# Patient Record
Sex: Male | Born: 1945 | Race: White | Hispanic: No | Marital: Married | Smoking: Former smoker
Health system: Southern US, Community
[De-identification: ages and names within clinical notes are randomized; demographics above are authoritative.]

## PROBLEM LIST (undated history)

## (undated) DIAGNOSIS — E119 Type 2 diabetes mellitus without complications: Secondary | ICD-10-CM

## (undated) DIAGNOSIS — I509 Heart failure, unspecified: Secondary | ICD-10-CM

## (undated) DIAGNOSIS — R51 Headache: Secondary | ICD-10-CM

## (undated) DIAGNOSIS — I4891 Unspecified atrial fibrillation: Secondary | ICD-10-CM

## (undated) DIAGNOSIS — K5790 Diverticulosis of intestine, part unspecified, without perforation or abscess without bleeding: Secondary | ICD-10-CM

## (undated) DIAGNOSIS — C801 Malignant (primary) neoplasm, unspecified: Secondary | ICD-10-CM

## (undated) DIAGNOSIS — I10 Essential (primary) hypertension: Secondary | ICD-10-CM

## (undated) DIAGNOSIS — F329 Major depressive disorder, single episode, unspecified: Secondary | ICD-10-CM

## (undated) DIAGNOSIS — K602 Anal fissure, unspecified: Secondary | ICD-10-CM

## (undated) DIAGNOSIS — R0600 Dyspnea, unspecified: Secondary | ICD-10-CM

## (undated) DIAGNOSIS — G4730 Sleep apnea, unspecified: Secondary | ICD-10-CM

## (undated) DIAGNOSIS — I499 Cardiac arrhythmia, unspecified: Secondary | ICD-10-CM

## (undated) DIAGNOSIS — F32A Depression, unspecified: Secondary | ICD-10-CM

## (undated) DIAGNOSIS — F419 Anxiety disorder, unspecified: Secondary | ICD-10-CM

## (undated) DIAGNOSIS — R519 Headache, unspecified: Secondary | ICD-10-CM

## (undated) HISTORY — PX: HIP SURGERY: SHX245

## (undated) HISTORY — DX: Unspecified atrial fibrillation: I48.91

## (undated) HISTORY — DX: Diverticulosis of intestine, part unspecified, without perforation or abscess without bleeding: K57.90

## (undated) HISTORY — PX: CARDIAC ELECTROPHYSIOLOGY MAPPING AND ABLATION: SHX1292

## (undated) HISTORY — PX: COLONOSCOPY: SHX174

## (undated) HISTORY — DX: Anal fissure, unspecified: K60.2

## (undated) HISTORY — PX: TONSILLECTOMY: SUR1361

---

## 2007-08-13 DIAGNOSIS — C801 Malignant (primary) neoplasm, unspecified: Secondary | ICD-10-CM

## 2007-08-13 HISTORY — DX: Malignant (primary) neoplasm, unspecified: C80.1

## 2007-08-13 HISTORY — PX: JOINT REPLACEMENT: SHX530

## 2017-07-30 NOTE — H&P (Signed)
TOTAL KNEE ADMISSION H&P  Patient is being admitted for left total knee arthroplasty.  Subjective:  Chief Complaint:   Left knee primary OA / pain  HPI: Robert Sutton, 71 y.o. male, has a history of pain and functional disability in the left knee due to arthritis and has failed non-surgical conservative treatments for greater than 12 weeks to include NSAID's and/or analgesics, corticosteriod injections, use of assistive devices and activity modification.  Onset of symptoms was gradual, starting 7-9 years ago with gradually worsening course since that time. The patient noted no past surgery on the left knee(s).  Patient currently rates pain in the left knee(s) at 7 out of 10 with activity. Patient has worsening of pain with activity and weight bearing, pain that interferes with activities of daily living, pain with passive range of motion, crepitus and joint swelling.  Patient has evidence of periarticular osteophytes and joint space narrowing by imaging studies.  There is no active infection.  Risks, benefits and expectations were discussed with the patient.  Risks including but not limited to the risk of anesthesia, blood clots, nerve damage, blood vessel damage, failure of the prosthesis, infection and up to and including death.  Patient understand the risks, benefits and expectations and wishes to proceed with surgery.   PCP: Raquel James, MD  D/C Plans:       Home   Post-op Meds:       No Rx given  Tranexamic Acid:      To be given - IV   Decadron:      Is to be given  FYI:     Eliquis  Norco  DME:   Pt already has equipment  PT:   OPPT Rx given   Past Medical History:  Diagnosis Date  . Anxiety   . Cancer [facility]) 2009   Prostate  . Depression   . Diabetes mellitus without complication ([place])   . Dyspnea   . Dysrhythmia   . Headache   . Hypertension   . Sleep apnea     Past Surgical History:  Procedure Laterality Date  . JOINT REPLACEMENT  2009   R Knee replacement  .  TONSILLECTOMY      No current facility-administered medications for this encounter.    Current Outpatient Medications  Medication Sig Dispense Refill Last Dose  . acetaminophen (TYLENOL) 500 MG tablet Take 1,000 mg by mouth every 6 (six) hours as needed for moderate pain or headache.     . albuterol (PROVENTIL HFA;VENTOLIN HFA) 108 (90 Base) MCG/ACT inhaler Inhale 1 puff into the lungs every 6 (six) hours as needed for wheezing or shortness of breath.     . alprazolam (XANAX) 2 MG tablet Take 2 mg by mouth 2 (two) times daily as needed for anxiety.     Marland Kitchen amLODipine (NORVASC) 5 MG tablet Take 5 mg by mouth 2 (two) times daily.      Marland Kitchen apixaban (ELIQUIS) 5 MG TABS tablet Take 5 mg by mouth 2 (two) times daily.     . fluticasone (FLONASE) 50 MCG/ACT nasal spray Place 1 spray into both nostrils 2 (two) times daily.     . furosemide (LASIX) 80 MG tablet Take 80-120 mg by mouth See admin instructions. Take 120 mg by mouth in the morning and take 80 mg by mouth in the early evening     . gabapentin (NEURONTIN) 300 MG capsule Take 600 mg by mouth 3 (three) times daily.     Marland Kitchen glipiZIDE (  GLUCOTROL) 10 MG tablet Take 10 mg by mouth 2 (two) times daily.     . insulin glargine (LANTUS) 100 unit/mL SOPN Inject 30 Units into the skin at bedtime.     Marland Kitchen lisinopril (PRINIVIL,ZESTRIL) 40 MG tablet Take 20 mg by mouth 2 (two) times daily.     . magnesium oxide (MAG-OX) 400 MG tablet Take 400 mg by mouth 2 (two) times daily.     Marland Kitchen omeprazole (PRILOSEC) 20 MG capsule Take 20 mg by mouth 2 (two) times daily.     . Polyvinyl Alcohol (LIQUID TEARS OP) Place 1-2 drops into both eyes daily as needed (for dry eyes).     . potassium chloride SA (K-DUR,KLOR-CON) 20 MEQ tablet Take 20-40 mEq by mouth See admin instructions. Take 40 mg by mouth in the morning and take 20 mg by mouth in the early evening     . vitamin B-12 (CYANOCOBALAMIN) 1000 MCG tablet Take 1,000 mcg by mouth daily.      No Known Allergies   Social  History   Tobacco Use  . Smoking status: Former Research scientist (life sciences)  . Smokeless tobacco: Never Used  . Tobacco comment: Quit 40 years ago  Substance Use Topics  . Alcohol use: Yes    Frequency: Never    Comment: Cocktail-Daily       Review of Systems  Constitutional: Negative.   HENT: Negative.   Eyes: Negative.   Respiratory: Negative.   Cardiovascular: Negative.   Gastrointestinal: Negative.   Genitourinary: Negative.   Musculoskeletal: Positive for joint pain.  Skin: Negative.   Neurological: Positive for headaches.  Endo/Heme/Allergies: Negative.   Psychiatric/Behavioral: Positive for depression. The patient is nervous/anxious.     Objective:  Physical Exam  Constitutional: He is oriented to person, place, and time. He appears well-developed.  HENT:  Head: Normocephalic.  Eyes: Pupils are equal, round, and reactive to light.  Neck: Neck supple. No JVD present. No tracheal deviation present. No thyromegaly present.  Cardiovascular: Normal rate, regular rhythm and intact distal pulses.  Respiratory: Effort normal and breath sounds normal. No respiratory distress. He has no wheezes.  GI: Soft. There is no tenderness. There is no guarding.  Musculoskeletal:       Left knee: He exhibits decreased range of motion, swelling and bony tenderness. He exhibits no ecchymosis, no deformity, no laceration and no erythema. Tenderness found.  Lymphadenopathy:    He has no cervical adenopathy.  Neurological: He is alert and oriented to person, place, and time. A sensory deficit (bilateral LE neuropathy) is present.  Skin: Skin is warm and dry.  Psychiatric: He has a normal mood and affect.      Imaging Review Plain radiographs demonstrate severe degenerative joint disease of the left knee.  The bone quality appears to be good for age and reported activity level.  Assessment/Plan:  End stage arthritis, left knee   The patient history, physical examination, clinical judgment of the  provider and imaging studies are consistent with end stage degenerative joint disease of the left knee and total knee arthroplasty is deemed medically necessary. The treatment options including medical management, injection therapy arthroscopy and arthroplasty were discussed at length. The risks and benefits of total knee arthroplasty were presented and reviewed. The risks due to aseptic loosening, infection, stiffness, patella tracking problems, thromboembolic complications and other imponderables were discussed. The patient acknowledged the explanation, agreed to proceed with the plan and consent was signed. Patient is being admitted for inpatient treatment for surgery, pain control,  PT, OT, prophylactic antibiotics, VTE prophylaxis, progressive ambulation and ADL's and discharge planning. The patient is planning to be discharged home.     [place] Pugh [place] Valli   PA-C  08/15/2017, 12:16 PM

## 2017-07-31 NOTE — Progress Notes (Signed)
NEED ORDERS IN Epic FOR 1-8 SURGERY, PRE OP IS 1-3

## 2017-08-13 NOTE — Progress Notes (Signed)
07-29-17 Cardiac clearance from [facility] [place]....   07-29-17 CBC (WNL) CMP (Abnormal)   07-15-17 ECHO on chart from [facility]  07-02-17 Stress Test on chart  06-29-17 HGA1C 5.7 on chart

## 2017-08-13 NOTE — Patient Instructions (Addendum)
Robert Sutton  08/13/2017   Your procedure is scheduled on: 08-19-17   Report to [facility] Main  Entrance Report to Short Stay/PACU at 5:30 AM   Call this number if you have problems the morning of surgery [phone]    Do not eat food or drink liquids :After Midnight.     Take these medicines the morning of surgery with A SIP OF WATER: Amlodipine (Norvasc), Gabapentin (Neurontin), and Omeprazole (Prilosec). You may also bring and use your inhaler and eyedrop as needed. DO NOT TAKE ANY DIABETIC MEDICATIONS DAY OF YOUR SURGERY                               You may not have any metal on your body including hair pins and              piercings  Do not wear jewelry, lotions, powders or  deodorant             Men may shave face and neck.   Do not bring valuables to the hospital. [place].  Contacts, dentures or bridgework may not be worn into surgery.  Leave suitcase in the car. After surgery it may be brought to your room.              Please read over the following fact sheets you were given: _____________________________________________________________________ How to Manage Your Diabetes Before and After Surgery  Why is it important to control my blood sugar before and after surgery? . Improving blood sugar levels before and after surgery helps healing and can limit problems. . A way of improving blood sugar control is eating a healthy diet by: o  Eating less sugar and carbohydrates o  Increasing activity/exercise o  Talking with your doctor about reaching your blood sugar goals . High blood sugars (greater than 180 mg/dL) can raise your risk of infections and slow your recovery, so you will need to focus on controlling your diabetes during the weeks before surgery. . Make sure that the doctor who takes care of your diabetes knows about your planned surgery including the date and location.  How do I manage  my blood sugar before surgery? . Check your blood sugar at least 4 times a day, starting 2 days before surgery, to make sure that the level is not too high or low. o Check your blood sugar the morning of your surgery when you wake up and every 2 hours until you get to the Short Stay unit. . If your blood sugar is less than 70 mg/dL, you will need to treat for low blood sugar: o Do not take insulin. o Treat a low blood sugar (less than 70 mg/dL) with  cup of clear juice (cranberry or apple), 4 glucose tablets, OR glucose gel. o Recheck blood sugar in 15 minutes after treatment (to make sure it is greater than 70 mg/dL). If your blood sugar is not greater than 70 mg/dL on recheck, call [phone] for further instructions. . Report your blood sugar to the short stay nurse when you get to Short Stay.  . If you are admitted to the hospital after surgery: o Your blood sugar will be checked by the staff and you will probably  be given insulin after surgery (instead of oral diabetes medicines) to make sure you have good blood sugar levels. o The goal for blood sugar control after surgery is 80-180 mg/dL.   WHAT DO I DO ABOUT MY DIABETES MEDICATION?  Marland Kitchen Do not take oral diabetes medicines (pills) the morning of surgery.  . THE DAY BEFORE SURGERY take only your morning dose of Glipizide  . THE NIGHT BEFORE SURGERY, take only 15 U of Lantus     Reviewed and Endorsed by [facility] Patient Education Committee, August 2015            [facility] - Preparing for Surgery Before surgery, you can play an important role.  Because skin is not sterile, your skin needs to be as free of germs as possible.  You can reduce the number of germs on your skin by washing with CHG (chlorahexidine gluconate) soap before surgery.  CHG is an antiseptic cleaner which kills germs and bonds with the skin to continue killing germs even after washing. Please DO NOT use if you have an allergy to CHG or antibacterial soaps.   If your skin becomes reddened/irritated stop using the CHG and inform your nurse when you arrive at Short Stay. Do not shave (including legs and underarms) for at least 48 hours prior to the first CHG shower.  You may shave your face/neck. Please follow these instructions carefully:  1.  Shower with CHG Soap the night before surgery and the  morning of Surgery.  2.  If you choose to wash your hair, wash your hair first as usual with your  normal  shampoo.  3.  After you shampoo, rinse your hair and body thoroughly to remove the  shampoo.                           4.  Use CHG as you would any other liquid soap.  You can apply chg directly  to the skin and wash                       Gently with a scrungie or clean washcloth.  5.  Apply the CHG Soap to your body ONLY FROM THE NECK DOWN.   Do not use on face/ open                           Wound or open sores. Avoid contact with eyes, ears mouth and genitals (private parts).                       Wash face,  Genitals (private parts) with your normal soap.             6.  Wash thoroughly, paying special attention to the area where your surgery  will be performed.  7.  Thoroughly rinse your body with warm water from the neck down.  8.  DO NOT shower/wash with your normal soap after using and rinsing off  the CHG Soap.                9.  Pat yourself dry with a clean towel.            10.  Wear clean pajamas.            11.  Place clean sheets on your bed the night of your first shower and do not  sleep with pets. Day of Surgery : Do not apply any lotions/deodorants the morning of surgery.  Please wear clean clothes to the hospital/surgery center.  FAILURE TO FOLLOW THESE INSTRUCTIONS MAY RESULT IN THE CANCELLATION OF YOUR SURGERY PATIENT SIGNATURE_________________________________  NURSE SIGNATURE__________________________________  ________________________________________________________________________   [name]  An incentive  spirometer is a tool that can help keep your lungs clear and active. This tool measures how well you are filling your lungs with each breath. Taking long deep breaths may help reverse or decrease the chance of developing breathing (pulmonary) problems (especially infection) following:  A long period of time when you are unable to move or be active. BEFORE THE PROCEDURE   If the spirometer includes an indicator to show your best effort, your nurse or respiratory therapist will set it to a desired goal.  If possible, sit up straight or lean slightly forward. Try not to slouch.  Hold the incentive spirometer in an upright position. INSTRUCTIONS FOR USE  1. Sit on the edge of your bed if possible, or sit up as far as you can in bed or on a chair. 2. Hold the incentive spirometer in an upright position. 3. Breathe out normally. 4. Place the mouthpiece in your mouth and seal your lips tightly around it. 5. Breathe in slowly and as deeply as possible, raising the piston or the ball toward the top of the column. 6. Hold your breath for 3-5 seconds or for as long as possible. Allow the piston or ball to fall to the bottom of the column. 7. Remove the mouthpiece from your mouth and breathe out normally. 8. Rest for a few seconds and repeat Steps 1 through 7 at least 10 times every 1-2 hours when you are awake. Take your time and take a few normal breaths between deep breaths. 9. The spirometer may include an indicator to show your best effort. Use the indicator as a goal to work toward during each repetition. 10. After each set of 10 deep breaths, practice coughing to be sure your lungs are clear. If you have an incision (the cut made at the time of surgery), support your incision when coughing by placing a pillow or rolled up towels firmly against it. Once you are able to get out of bed, walk around indoors and cough well. You may stop using the incentive spirometer when instructed by your caregiver.   RISKS AND COMPLICATIONS  Take your time so you do not get dizzy or light-headed.  If you are in pain, you may need to take or ask for pain medication before doing incentive spirometry. It is harder to take a deep breath if you are having pain. AFTER USE  Rest and breathe slowly and easily.  It can be helpful to keep track of a log of your progress. Your caregiver can provide you with a simple table to help with this. If you are using the spirometer at home, follow these instructions: [place] IF:   You are having difficultly using the spirometer.  You have trouble using the spirometer as often as instructed.  Your pain medication is not giving enough relief while using the spirometer.  You develop fever of 100.5 F (38.1 C) or higher. SEEK IMMEDIATE MEDICAL CARE IF:   You cough up bloody sputum that had not been present before.  You develop fever of 102 F (38.9 C) or greater.  You develop worsening pain at or near the incision site. MAKE SURE YOU:  Understand these instructions.  Will watch your condition.  Will get help right away if you are not doing well or get worse. Document Released: 12/09/2006 Document Revised: 10/21/2011 Document Reviewed: 02/09/2007 ExitCare Patient Information 2014 ExitCare, [place].   ________________________________________________________________________  WHAT IS A BLOOD TRANSFUSION? Blood Transfusion Information  A transfusion is the replacement of blood or some of its parts. Blood is made up of multiple cells which provide different functions.  Red blood cells carry oxygen and are used for blood loss replacement.  White blood cells fight against infection.  Platelets control bleeding.  Plasma helps clot blood.  Other blood products are available for specialized needs, such as hemophilia or other clotting disorders. BEFORE THE TRANSFUSION  Who gives blood for transfusions?   Healthy volunteers who are fully evaluated  to make sure their blood is safe. This is blood bank blood. Transfusion therapy is the safest it has ever been in the practice of medicine. Before blood is taken from a donor, a complete history is taken to make sure that person has no history of diseases nor engages in risky social behavior (examples are intravenous drug use or sexual activity with multiple partners). The donor's travel history is screened to minimize risk of transmitting infections, such as malaria. The donated blood is tested for signs of infectious diseases, such as HIV and hepatitis. The blood is then tested to be sure it is compatible with you in order to minimize the chance of a transfusion reaction. If you or a relative donates blood, this is often done in anticipation of surgery and is not appropriate for emergency situations. It takes many days to process the donated blood. RISKS AND COMPLICATIONS Although transfusion therapy is very safe and saves many lives, the main dangers of transfusion include:   Getting an infectious disease.  Developing a transfusion reaction. This is an allergic reaction to something in the blood you were given. Every precaution is taken to prevent this. The decision to have a blood transfusion has been considered carefully by your caregiver before blood is given. Blood is not given unless the benefits outweigh the risks. AFTER THE TRANSFUSION  Right after receiving a blood transfusion, you will usually feel much better and more energetic. This is especially true if your red blood cells have gotten low (anemic). The transfusion raises the level of the red blood cells which carry oxygen, and this usually causes an energy increase.  The nurse administering the transfusion will monitor you carefully for complications. HOME CARE INSTRUCTIONS  No special instructions are needed after a transfusion. You may find your energy is better. Speak with your caregiver about any limitations on activity for  underlying diseases you may have. SEEK MEDICAL CARE IF:   Your condition is not improving after your transfusion.  You develop redness or irritation at the intravenous (IV) site. SEEK IMMEDIATE MEDICAL CARE IF:  Any of the following symptoms occur over the next 12 hours:  Shaking chills.  You have a temperature by mouth above 102 F (38.9 C), not controlled by medicine.  Chest, back, or muscle pain.  People around you feel you are not acting correctly or are confused.  Shortness of breath or difficulty breathing.  Dizziness and fainting.  You get a rash or develop hives.  You have a decrease in urine output.  Your urine turns a dark color or changes to pink, red, or brown. Any of the following symptoms occur over the next 10 days:  You have a temperature  by mouth above 102 F (38.9 C), not controlled by medicine.  Shortness of breath.  Weakness after normal activity.  The white part of the eye turns yellow (jaundice).  You have a decrease in the amount of urine or are urinating less often.  Your urine turns a dark color or changes to pink, red, or brown. Document Released: 07/26/2000 Document Revised: 10/21/2011 Document Reviewed: 03/14/2008 [facility] Patient Information 2014 [place], [place].  _______________________________________________________________________

## 2017-08-14 ENCOUNTER — Other Ambulatory Visit: Payer: Self-pay

## 2017-08-14 ENCOUNTER — Encounter (HOSPITAL_COMMUNITY)
Admission: RE | Admit: 2017-08-14 | Discharge: 2017-08-14 | Disposition: A | Discharge status: Home / Self Care | Payer: Non-veteran care | Source: Ambulatory Visit | Attending: Orthopedic Surgery | Admitting: Orthopedic Surgery

## 2017-08-14 ENCOUNTER — Encounter (HOSPITAL_COMMUNITY): Payer: Self-pay

## 2017-08-14 DIAGNOSIS — Z01812 Encounter for preprocedural laboratory examination: Secondary | ICD-10-CM | POA: Diagnosis present

## 2017-08-14 DIAGNOSIS — M1712 Unilateral primary osteoarthritis, left knee: Secondary | ICD-10-CM | POA: Insufficient documentation

## 2017-08-14 HISTORY — DX: Depression, unspecified: F32.A

## 2017-08-14 HISTORY — DX: Malignant (primary) neoplasm, unspecified: C80.1

## 2017-08-14 HISTORY — DX: Anxiety disorder, unspecified: F41.9

## 2017-08-14 HISTORY — DX: Cardiac arrhythmia, unspecified: I49.9

## 2017-08-14 HISTORY — DX: Type 2 diabetes mellitus without complications: E11.9

## 2017-08-14 HISTORY — DX: Sleep apnea, unspecified: G47.30

## 2017-08-14 HISTORY — DX: Dyspnea, unspecified: R06.00

## 2017-08-14 HISTORY — DX: Major depressive disorder, single episode, unspecified: F32.9

## 2017-08-14 HISTORY — DX: Headache, unspecified: R51.9

## 2017-08-14 HISTORY — DX: Essential (primary) hypertension: I10

## 2017-08-14 HISTORY — DX: Headache: R51

## 2017-08-14 LAB — GLUCOSE, CAPILLARY: Glucose-Capillary: 150 mg/dL — ABNORMAL HIGH (ref 65–99)

## 2017-08-14 LAB — BASIC METABOLIC PANEL
Anion gap: 8 (ref 5–15)
BUN: 21 mg/dL — ABNORMAL HIGH (ref 6–20)
CALCIUM: 9.2 mg/dL (ref 8.9–10.3)
CO2: 33 mmol/L — AB (ref 22–32)
CREATININE: 1.29 mg/dL — AB (ref 0.61–1.24)
Chloride: 97 mmol/L — ABNORMAL LOW (ref 101–111)
GFR calc non Af Amer: 54 mL/min — ABNORMAL LOW (ref >60)
GLUCOSE: 119 mg/dL — AB (ref 65–99)
Potassium: 4.3 mmol/L (ref 3.5–5.1)
Sodium: 138 mmol/L (ref 135–145)

## 2017-08-14 LAB — ABO/RH: ABO/RH(D): A POS

## 2017-08-14 LAB — SURGICAL PCR SCREEN
MRSA, PCR: NEGATIVE
STAPHYLOCOCCUS AUREUS: POSITIVE — AB

## 2017-08-15 NOTE — Progress Notes (Signed)
08-14-17 PCR result routed to Dr. Alvan Dame for review.

## 2017-08-18 MED ORDER — DEXTROSE 5 % IV SOLN
3.0000 g | INTRAVENOUS | Status: AC
  Administered 2017-08-19: 3 g via INTRAVENOUS
  Filled 2017-08-18: qty 3

## 2017-08-19 ENCOUNTER — Encounter (HOSPITAL_COMMUNITY)
Admission: RE | Disposition: A | Discharge status: Home / Self Care | Payer: Self-pay | Source: Ambulatory Visit | Attending: Orthopedic Surgery

## 2017-08-19 ENCOUNTER — Inpatient Hospital Stay (HOSPITAL_COMMUNITY): Payer: Non-veteran care | Admitting: Certified Registered Nurse Anesthetist

## 2017-08-19 ENCOUNTER — Inpatient Hospital Stay (HOSPITAL_COMMUNITY)
Admission: RE | Admit: 2017-08-19 | Discharge: 2017-08-22 | DRG: 470 | Disposition: A | Discharge status: Home / Self Care | Payer: Non-veteran care | Source: Ambulatory Visit | Attending: Orthopedic Surgery | Admitting: Orthopedic Surgery

## 2017-08-19 ENCOUNTER — Other Ambulatory Visit: Payer: Self-pay

## 2017-08-19 ENCOUNTER — Encounter (HOSPITAL_COMMUNITY): Payer: Self-pay | Admitting: *Deleted

## 2017-08-19 DIAGNOSIS — Z96652 Presence of left artificial knee joint: Secondary | ICD-10-CM

## 2017-08-19 DIAGNOSIS — I5022 Chronic systolic (congestive) heart failure: Secondary | ICD-10-CM | POA: Diagnosis present

## 2017-08-19 DIAGNOSIS — F329 Major depressive disorder, single episode, unspecified: Secondary | ICD-10-CM | POA: Diagnosis present

## 2017-08-19 DIAGNOSIS — M659 Synovitis and tenosynovitis, unspecified: Secondary | ICD-10-CM | POA: Diagnosis present

## 2017-08-19 DIAGNOSIS — Z96659 Presence of unspecified artificial knee joint: Secondary | ICD-10-CM

## 2017-08-19 DIAGNOSIS — Z7951 Long term (current) use of inhaled steroids: Secondary | ICD-10-CM | POA: Diagnosis not present

## 2017-08-19 DIAGNOSIS — Z794 Long term (current) use of insulin: Secondary | ICD-10-CM

## 2017-08-19 DIAGNOSIS — R0902 Hypoxemia: Secondary | ICD-10-CM | POA: Diagnosis not present

## 2017-08-19 DIAGNOSIS — E119 Type 2 diabetes mellitus without complications: Secondary | ICD-10-CM | POA: Diagnosis present

## 2017-08-19 DIAGNOSIS — M1712 Unilateral primary osteoarthritis, left knee: Principal | ICD-10-CM | POA: Diagnosis present

## 2017-08-19 DIAGNOSIS — G4730 Sleep apnea, unspecified: Secondary | ICD-10-CM | POA: Diagnosis present

## 2017-08-19 DIAGNOSIS — Z7901 Long term (current) use of anticoagulants: Secondary | ICD-10-CM | POA: Diagnosis not present

## 2017-08-19 DIAGNOSIS — Z6841 Body Mass Index (BMI) 40.0 and over, adult: Secondary | ICD-10-CM

## 2017-08-19 DIAGNOSIS — I509 Heart failure, unspecified: Secondary | ICD-10-CM | POA: Diagnosis not present

## 2017-08-19 DIAGNOSIS — Z79899 Other long term (current) drug therapy: Secondary | ICD-10-CM

## 2017-08-19 DIAGNOSIS — Z96651 Presence of right artificial knee joint: Secondary | ICD-10-CM | POA: Diagnosis present

## 2017-08-19 DIAGNOSIS — I499 Cardiac arrhythmia, unspecified: Secondary | ICD-10-CM | POA: Diagnosis not present

## 2017-08-19 DIAGNOSIS — Z7984 Long term (current) use of oral hypoglycemic drugs: Secondary | ICD-10-CM | POA: Diagnosis not present

## 2017-08-19 DIAGNOSIS — F419 Anxiety disorder, unspecified: Secondary | ICD-10-CM | POA: Diagnosis present

## 2017-08-19 DIAGNOSIS — I110 Hypertensive heart disease with heart failure: Secondary | ICD-10-CM | POA: Diagnosis present

## 2017-08-19 DIAGNOSIS — Z87891 Personal history of nicotine dependence: Secondary | ICD-10-CM

## 2017-08-19 HISTORY — DX: Heart failure, unspecified: I50.9

## 2017-08-19 HISTORY — PX: TOTAL KNEE ARTHROPLASTY: SHX125

## 2017-08-19 LAB — GLUCOSE, CAPILLARY
GLUCOSE-CAPILLARY: 146 mg/dL — AB (ref 65–99)
GLUCOSE-CAPILLARY: 170 mg/dL — AB (ref 65–99)
GLUCOSE-CAPILLARY: 170 mg/dL — AB (ref 65–99)
Glucose-Capillary: 228 mg/dL — ABNORMAL HIGH (ref 65–99)

## 2017-08-19 LAB — CBC WITH DIFFERENTIAL/PLATELET
BASOS PCT: 0 %
Basophils Absolute: 0.1 10*3/uL (ref 0.0–0.1)
Eosinophils Absolute: 0.2 10*3/uL (ref 0.0–0.7)
Eosinophils Relative: 2 %
HEMATOCRIT: 46.1 % (ref 39.0–52.0)
Hemoglobin: 15.4 g/dL (ref 13.0–17.0)
LYMPHS ABS: 3 10*3/uL (ref 0.7–4.0)
LYMPHS PCT: 22 %
MCH: 33.1 pg (ref 26.0–34.0)
MCHC: 33.4 g/dL (ref 30.0–36.0)
MCV: 99.1 fL (ref 78.0–100.0)
MONO ABS: 1.1 10*3/uL — AB (ref 0.1–1.0)
MONOS PCT: 8 %
Neutro Abs: 9.3 10*3/uL — ABNORMAL HIGH (ref 1.7–7.7)
Neutrophils Relative %: 68 %
Platelets: 187 10*3/uL (ref 150–400)
RBC: 4.65 MIL/uL (ref 4.22–5.81)
RDW: 15 % (ref 11.5–15.5)
WBC: 13.6 10*3/uL — ABNORMAL HIGH (ref 4.0–10.5)

## 2017-08-19 LAB — TYPE AND SCREEN
ABO/RH(D): A POS
ABO/RH(D): A POS
ANTIBODY SCREEN: NEGATIVE
ANTIBODY SCREEN: NEGATIVE

## 2017-08-19 SURGERY — ARTHROPLASTY, KNEE, TOTAL
Anesthesia: Regional | Site: Knee | Laterality: Left

## 2017-08-19 MED ORDER — PROPOFOL 10 MG/ML IV BOLUS
INTRAVENOUS | Status: AC
  Filled 2017-08-19: qty 60

## 2017-08-19 MED ORDER — ONDANSETRON HCL 4 MG/2ML IJ SOLN: 4.0000 mg | Freq: Four times a day (QID) | INTRAMUSCULAR | Status: DC | PRN

## 2017-08-19 MED ORDER — SODIUM CHLORIDE 0.9 % IR SOLN
Status: DC | PRN
  Administered 2017-08-19: 1000 mL

## 2017-08-19 MED ORDER — HYDROMORPHONE HCL 1 MG/ML IJ SOLN
INTRAMUSCULAR | Status: AC
Start: 2017-08-19 — End: 2017-08-19
  Filled 2017-08-19: qty 1

## 2017-08-19 MED ORDER — APIXABAN 2.5 MG PO TABS
2.5000 mg | ORAL_TABLET | Freq: Two times a day (BID) | ORAL | Status: DC
  Administered 2017-08-20 – 2017-08-22 (×5): 2.5 mg via ORAL
  Filled 2017-08-19 (×5): qty 1

## 2017-08-19 MED ORDER — INSULIN ASPART 100 UNIT/ML ~~LOC~~ SOLN
0.0000 [IU] | Freq: Three times a day (TID) | SUBCUTANEOUS | Status: DC
  Administered 2017-08-19: 3 [IU] via SUBCUTANEOUS
  Administered 2017-08-20: 5 [IU] via SUBCUTANEOUS
  Administered 2017-08-20: 3 [IU] via SUBCUTANEOUS
  Administered 2017-08-20: 5 [IU] via SUBCUTANEOUS
  Administered 2017-08-22: 08:00:00 3 [IU] via SUBCUTANEOUS

## 2017-08-19 MED ORDER — DIPHENHYDRAMINE HCL 12.5 MG/5ML PO ELIX: 12.5000 mg | ORAL_SOLUTION | ORAL | Status: DC | PRN

## 2017-08-19 MED ORDER — BUPIVACAINE-EPINEPHRINE (PF) 0.25% -1:200000 IJ SOLN
INTRAMUSCULAR | Status: DC | PRN
  Administered 2017-08-19: 30 mL via PERINEURAL

## 2017-08-19 MED ORDER — ALBUMIN HUMAN 5 % IV SOLN
INTRAVENOUS | Status: DC | PRN
  Administered 2017-08-19: 08:00:00 via INTRAVENOUS

## 2017-08-19 MED ORDER — MIDAZOLAM HCL 5 MG/5ML IJ SOLN
INTRAMUSCULAR | Status: DC | PRN
  Administered 2017-08-19: 2 mg via INTRAVENOUS

## 2017-08-19 MED ORDER — POLYETHYLENE GLYCOL 3350 17 G PO PACK: 17.0000 g | PACK | Freq: Two times a day (BID) | ORAL | 0 refills | Status: DC

## 2017-08-19 MED ORDER — CEFAZOLIN SODIUM-DEXTROSE 2-4 GM/100ML-% IV SOLN
2.0000 g | Freq: Four times a day (QID) | INTRAVENOUS | Status: AC
  Administered 2017-08-19 (×2): 2 g via INTRAVENOUS
  Filled 2017-08-19 (×2): qty 100

## 2017-08-19 MED ORDER — METHOCARBAMOL 500 MG PO TABS: 500.0000 mg | ORAL_TABLET | Freq: Four times a day (QID) | ORAL | 0 refills | Status: DC | PRN

## 2017-08-19 MED ORDER — SODIUM CHLORIDE 0.9 % IJ SOLN
INTRAMUSCULAR | Status: DC | PRN
  Administered 2017-08-19: 30 mL

## 2017-08-19 MED ORDER — CHLORHEXIDINE GLUCONATE 4 % EX LIQD: 60.0000 mL | Freq: Once | CUTANEOUS | Status: DC

## 2017-08-19 MED ORDER — FUROSEMIDE 40 MG PO TABS
120.0000 mg | ORAL_TABLET | Freq: Every day | ORAL | Status: DC
  Administered 2017-08-20 – 2017-08-22 (×3): 120 mg via ORAL
  Filled 2017-08-19 (×3): qty 3

## 2017-08-19 MED ORDER — GLIPIZIDE 10 MG PO TABS
10.0000 mg | ORAL_TABLET | Freq: Two times a day (BID) | ORAL | Status: DC
  Administered 2017-08-19 – 2017-08-22 (×5): 10 mg via ORAL
  Filled 2017-08-19 (×6): qty 1

## 2017-08-19 MED ORDER — PHENYLEPHRINE HCL 10 MG/ML IJ SOLN
INTRAVENOUS | Status: DC | PRN
  Administered 2017-08-19: 75 ug/min via INTRAVENOUS

## 2017-08-19 MED ORDER — ALPRAZOLAM 1 MG PO TABS
2.0000 mg | ORAL_TABLET | Freq: Two times a day (BID) | ORAL | Status: DC | PRN
  Administered 2017-08-19 – 2017-08-21 (×3): 2 mg via ORAL
  Administered 2017-08-21: 22:00:00 1 mg via ORAL
  Filled 2017-08-19 (×4): qty 2

## 2017-08-19 MED ORDER — LACTATED RINGERS IV SOLN
INTRAVENOUS | Status: DC | PRN
  Administered 2017-08-19 (×2): via INTRAVENOUS

## 2017-08-19 MED ORDER — OXYCODONE HCL 5 MG PO TABS: 5.0000 mg | ORAL_TABLET | Freq: Once | ORAL | Status: DC | PRN

## 2017-08-19 MED ORDER — POTASSIUM CHLORIDE CRYS ER 20 MEQ PO TBCR
20.0000 meq | EXTENDED_RELEASE_TABLET | Freq: Every evening | ORAL | Status: DC
  Administered 2017-08-20 – 2017-08-21 (×2): 20 meq via ORAL
  Filled 2017-08-19 (×2): qty 1

## 2017-08-19 MED ORDER — SODIUM CHLORIDE 0.9 % IV SOLN
INTRAVENOUS | Status: DC
  Administered 2017-08-19 – 2017-08-20 (×2): via INTRAVENOUS

## 2017-08-19 MED ORDER — ALBUTEROL SULFATE (2.5 MG/3ML) 0.083% IN NEBU: 2.5000 mg | INHALATION_SOLUTION | Freq: Four times a day (QID) | RESPIRATORY_TRACT | Status: DC | PRN

## 2017-08-19 MED ORDER — CELECOXIB 200 MG PO CAPS
200.0000 mg | ORAL_CAPSULE | Freq: Two times a day (BID) | ORAL | Status: DC
  Administered 2017-08-19: 200 mg via ORAL
  Filled 2017-08-19 (×2): qty 1

## 2017-08-19 MED ORDER — HYDROMORPHONE HCL 1 MG/ML IJ SOLN
0.5000 mg | INTRAMUSCULAR | Status: DC | PRN
  Administered 2017-08-21: 1 mg via INTRAVENOUS
  Filled 2017-08-19: qty 1

## 2017-08-19 MED ORDER — FUROSEMIDE 40 MG PO TABS: 80.0000 mg | ORAL_TABLET | ORAL | Status: DC

## 2017-08-19 MED ORDER — KETOROLAC TROMETHAMINE 30 MG/ML IJ SOLN
INTRAMUSCULAR | Status: DC | PRN
  Administered 2017-08-19: 30 mg

## 2017-08-19 MED ORDER — HYDROMORPHONE HCL 1 MG/ML IJ SOLN
0.2500 mg | INTRAMUSCULAR | Status: DC | PRN
  Administered 2017-08-19 (×2): 0.5 mg via INTRAVENOUS

## 2017-08-19 MED ORDER — OXYCODONE HCL 5 MG/5ML PO SOLN: 5.0000 mg | Freq: Once | ORAL | Status: DC | PRN

## 2017-08-19 MED ORDER — FERROUS SULFATE 325 (65 FE) MG PO TABS
325.0000 mg | ORAL_TABLET | Freq: Three times a day (TID) | ORAL | Status: DC
  Administered 2017-08-20 – 2017-08-22 (×7): 325 mg via ORAL
  Filled 2017-08-19 (×7): qty 1

## 2017-08-19 MED ORDER — HYDROCODONE-ACETAMINOPHEN 7.5-325 MG PO TABS
2.0000 | ORAL_TABLET | ORAL | Status: DC | PRN
  Administered 2017-08-19 – 2017-08-20 (×3): 2 via ORAL
  Filled 2017-08-19 (×3): qty 2

## 2017-08-19 MED ORDER — BUPIVACAINE-EPINEPHRINE 0.25% -1:200000 IJ SOLN
INTRAMUSCULAR | Status: AC
  Filled 2017-08-19: qty 1

## 2017-08-19 MED ORDER — POTASSIUM CHLORIDE CRYS ER 20 MEQ PO TBCR: 20.0000 meq | EXTENDED_RELEASE_TABLET | ORAL | Status: DC

## 2017-08-19 MED ORDER — FLUTICASONE PROPIONATE 50 MCG/ACT NA SUSP
1.0000 | Freq: Two times a day (BID) | NASAL | Status: DC
  Administered 2017-08-19 – 2017-08-22 (×4): 1 via NASAL
  Filled 2017-08-19: qty 16

## 2017-08-19 MED ORDER — NON FORMULARY: 20.0000 mg | Freq: Two times a day (BID) | Status: DC

## 2017-08-19 MED ORDER — LIDOCAINE 2% (20 MG/ML) 5 ML SYRINGE
INTRAMUSCULAR | Status: DC | PRN
  Administered 2017-08-19: 100 mg via INTRAVENOUS

## 2017-08-19 MED ORDER — ACETAMINOPHEN 325 MG PO TABS: 650.0000 mg | ORAL_TABLET | ORAL | Status: DC | PRN

## 2017-08-19 MED ORDER — HYDROCODONE-ACETAMINOPHEN 7.5-325 MG PO TABS
1.0000 | ORAL_TABLET | ORAL | Status: DC | PRN
  Administered 2017-08-19: 1 via ORAL
  Filled 2017-08-19: qty 1

## 2017-08-19 MED ORDER — DEXAMETHASONE SODIUM PHOSPHATE 10 MG/ML IJ SOLN
10.0000 mg | Freq: Once | INTRAMUSCULAR | Status: AC
  Administered 2017-08-20: 10 mg via INTRAVENOUS
  Filled 2017-08-19: qty 1

## 2017-08-19 MED ORDER — DEXAMETHASONE SODIUM PHOSPHATE 10 MG/ML IJ SOLN: 10.0000 mg | Freq: Once | INTRAMUSCULAR | Status: DC

## 2017-08-19 MED ORDER — FENTANYL CITRATE (PF) 100 MCG/2ML IJ SOLN
INTRAMUSCULAR | Status: DC | PRN
  Administered 2017-08-19 (×2): 50 ug via INTRAVENOUS

## 2017-08-19 MED ORDER — ACETAMINOPHEN 650 MG RE SUPP: 650.0000 mg | RECTAL | Status: DC | PRN

## 2017-08-19 MED ORDER — BISACODYL 10 MG RE SUPP: 10.0000 mg | Freq: Every day | RECTAL | Status: DC | PRN

## 2017-08-19 MED ORDER — SODIUM CHLORIDE 0.9 % IJ SOLN
INTRAMUSCULAR | Status: AC
  Filled 2017-08-19: qty 50

## 2017-08-19 MED ORDER — DOCUSATE SODIUM 100 MG PO CAPS
100.0000 mg | ORAL_CAPSULE | Freq: Two times a day (BID) | ORAL | Status: DC
  Administered 2017-08-19 – 2017-08-22 (×6): 100 mg via ORAL
  Filled 2017-08-19 (×6): qty 1

## 2017-08-19 MED ORDER — FENTANYL CITRATE (PF) 100 MCG/2ML IJ SOLN
INTRAMUSCULAR | Status: AC
  Filled 2017-08-19: qty 2

## 2017-08-19 MED ORDER — ROPIVACAINE HCL 5 MG/ML IJ SOLN
INTRAMUSCULAR | Status: DC | PRN
  Administered 2017-08-19: 20 mL via PERINEURAL

## 2017-08-19 MED ORDER — 0.9 % SODIUM CHLORIDE (POUR BTL) OPTIME
TOPICAL | Status: DC | PRN
  Administered 2017-08-19: 1000 mL

## 2017-08-19 MED ORDER — METOCLOPRAMIDE HCL 5 MG/ML IJ SOLN: 5.0000 mg | Freq: Three times a day (TID) | INTRAMUSCULAR | Status: DC | PRN

## 2017-08-19 MED ORDER — METHOCARBAMOL 500 MG PO TABS
500.0000 mg | ORAL_TABLET | Freq: Four times a day (QID) | ORAL | Status: DC | PRN
Start: 2017-08-19 — End: 2017-08-22
  Administered 2017-08-19: 500 mg via ORAL
  Filled 2017-08-19: qty 1

## 2017-08-19 MED ORDER — POLYETHYLENE GLYCOL 3350 17 G PO PACK
17.0000 g | PACK | Freq: Two times a day (BID) | ORAL | Status: DC
  Administered 2017-08-19 – 2017-08-22 (×6): 17 g via ORAL
  Filled 2017-08-19 (×6): qty 1

## 2017-08-19 MED ORDER — AMLODIPINE BESYLATE 5 MG PO TABS
5.0000 mg | ORAL_TABLET | Freq: Two times a day (BID) | ORAL | Status: DC
  Administered 2017-08-19 – 2017-08-22 (×6): 5 mg via ORAL
  Filled 2017-08-19 (×6): qty 1

## 2017-08-19 MED ORDER — ALUM & MAG HYDROXIDE-SIMETH 200-200-20 MG/5ML PO SUSP: 15.0000 mL | ORAL | Status: DC | PRN

## 2017-08-19 MED ORDER — FUROSEMIDE 40 MG PO TABS
80.0000 mg | ORAL_TABLET | Freq: Every evening | ORAL | Status: DC
  Administered 2017-08-20 – 2017-08-21 (×2): 80 mg via ORAL
  Filled 2017-08-19 (×2): qty 2

## 2017-08-19 MED ORDER — DOCUSATE SODIUM 100 MG PO CAPS: 100.0000 mg | ORAL_CAPSULE | Freq: Two times a day (BID) | ORAL | 0 refills | Status: DC

## 2017-08-19 MED ORDER — MENTHOL 3 MG MT LOZG: 1.0000 | LOZENGE | OROMUCOSAL | Status: DC | PRN

## 2017-08-19 MED ORDER — METOCLOPRAMIDE HCL 5 MG PO TABS: 5.0000 mg | ORAL_TABLET | Freq: Three times a day (TID) | ORAL | Status: DC | PRN

## 2017-08-19 MED ORDER — KETOROLAC TROMETHAMINE 30 MG/ML IJ SOLN
INTRAMUSCULAR | Status: AC
  Filled 2017-08-19: qty 1

## 2017-08-19 MED ORDER — ONDANSETRON HCL 4 MG/2ML IJ SOLN
INTRAMUSCULAR | Status: DC | PRN
  Administered 2017-08-19: 4 mg via INTRAVENOUS

## 2017-08-19 MED ORDER — FERROUS SULFATE 325 (65 FE) MG PO TABS: 325.0000 mg | ORAL_TABLET | Freq: Three times a day (TID) | ORAL | 3 refills | Status: DC

## 2017-08-19 MED ORDER — GABAPENTIN 300 MG PO CAPS
600.0000 mg | ORAL_CAPSULE | Freq: Three times a day (TID) | ORAL | Status: DC
  Administered 2017-08-19 – 2017-08-22 (×9): 600 mg via ORAL
  Filled 2017-08-19 (×10): qty 2

## 2017-08-19 MED ORDER — MAGNESIUM CITRATE PO SOLN: 1.0000 | Freq: Once | ORAL | Status: DC | PRN

## 2017-08-19 MED ORDER — METHOCARBAMOL 1000 MG/10ML IJ SOLN
500.0000 mg | Freq: Four times a day (QID) | INTRAVENOUS | Status: DC | PRN
  Filled 2017-08-19: qty 5

## 2017-08-19 MED ORDER — INSULIN GLARGINE 100 UNIT/ML ~~LOC~~ SOLN
30.0000 [IU] | Freq: Every day | SUBCUTANEOUS | Status: DC
  Administered 2017-08-19 – 2017-08-21 (×3): 30 [IU] via SUBCUTANEOUS
  Filled 2017-08-19 (×4): qty 0.3

## 2017-08-19 MED ORDER — PROPOFOL 10 MG/ML IV BOLUS
INTRAVENOUS | Status: DC | PRN
  Administered 2017-08-19: 150 mg via INTRAVENOUS

## 2017-08-19 MED ORDER — SODIUM CHLORIDE 0.9 % IV SOLN
1000.0000 mg | Freq: Once | INTRAVENOUS | Status: AC
  Administered 2017-08-19: 1000 mg via INTRAVENOUS
  Filled 2017-08-19: qty 1100

## 2017-08-19 MED ORDER — POTASSIUM CHLORIDE CRYS ER 20 MEQ PO TBCR
40.0000 meq | EXTENDED_RELEASE_TABLET | Freq: Every day | ORAL | Status: DC
  Administered 2017-08-20 – 2017-08-22 (×3): 40 meq via ORAL
  Filled 2017-08-19 (×3): qty 2

## 2017-08-19 MED ORDER — PHENOL 1.4 % MT LIQD: 1.0000 | OROMUCOSAL | Status: DC | PRN

## 2017-08-19 MED ORDER — OMEPRAZOLE 20 MG PO CPDR
20.0000 mg | DELAYED_RELEASE_CAPSULE | Freq: Two times a day (BID) | ORAL | Status: DC
  Administered 2017-08-19 – 2017-08-22 (×6): 20 mg via ORAL
  Filled 2017-08-19 (×7): qty 1

## 2017-08-19 MED ORDER — ONDANSETRON HCL 4 MG PO TABS: 4.0000 mg | ORAL_TABLET | Freq: Four times a day (QID) | ORAL | Status: DC | PRN

## 2017-08-19 MED ORDER — PROMETHAZINE HCL 25 MG/ML IJ SOLN
6.2500 mg | INTRAMUSCULAR | Status: DC | PRN
Start: 2017-08-19 — End: 2017-08-19

## 2017-08-19 MED ORDER — MIDAZOLAM HCL 2 MG/2ML IJ SOLN
INTRAMUSCULAR | Status: AC
  Filled 2017-08-19: qty 2

## 2017-08-19 MED ORDER — TRANEXAMIC ACID 1000 MG/10ML IV SOLN
1000.0000 mg | INTRAVENOUS | Status: AC
  Administered 2017-08-19: 1000 mg via INTRAVENOUS
  Filled 2017-08-19: qty 1100

## 2017-08-19 MED ORDER — HYDROCODONE-ACETAMINOPHEN 7.5-325 MG PO TABS: 1.0000 | ORAL_TABLET | ORAL | 0 refills | Status: DC | PRN

## 2017-08-19 MED ORDER — PHENYLEPHRINE 40 MCG/ML (10ML) SYRINGE FOR IV PUSH (FOR BLOOD PRESSURE SUPPORT)
PREFILLED_SYRINGE | INTRAVENOUS | Status: DC | PRN
  Administered 2017-08-19: 160 ug via INTRAVENOUS
  Administered 2017-08-19 (×2): 80 ug via INTRAVENOUS

## 2017-08-19 SURGICAL SUPPLY — 48 items
ADH SKN CLS APL DERMABOND .7 (GAUZE/BANDAGES/DRESSINGS) ×1
BAG DECANTER FOR FLEXI CONT (MISCELLANEOUS) IMPLANT
BAG SPEC THK2 15X12 ZIP CLS (MISCELLANEOUS)
BAG ZIPLOCK 12X15 (MISCELLANEOUS) IMPLANT
BANDAGE ACE 6X5 VEL STRL LF (GAUZE/BANDAGES/DRESSINGS) ×3 IMPLANT
BLADE SAW SGTL 11.0X1.19X90.0M (BLADE) IMPLANT
BLADE SAW SGTL 13.0X1.19X90.0M (BLADE) ×3 IMPLANT
BONE CEMENT GENTAMICIN (Cement) ×6 IMPLANT
BOWL SMART MIX CTS (DISPOSABLE) ×3 IMPLANT
CAPT KNEE TOTAL 3 ATTUNE ×2 IMPLANT
CEMENT BONE GENTAMICIN 40 (Cement) IMPLANT
COVER SURGICAL LIGHT HANDLE (MISCELLANEOUS) ×3 IMPLANT
CUFF TOURN SGL QUICK 34 (TOURNIQUET CUFF) ×3
CUFF TRNQT CYL 34X4X40X1 (TOURNIQUET CUFF) ×1 IMPLANT
DECANTER SPIKE VIAL GLASS SM (MISCELLANEOUS) ×3 IMPLANT
DERMABOND ADVANCED (GAUZE/BANDAGES/DRESSINGS) ×2
DERMABOND ADVANCED .7 DNX12 (GAUZE/BANDAGES/DRESSINGS) ×1 IMPLANT
DRAPE U-SHAPE 47X51 STRL (DRAPES) ×3 IMPLANT
DRESSING AQUACEL AG SP 3.5X10 (GAUZE/BANDAGES/DRESSINGS) ×1 IMPLANT
DRSG AQUACEL AG SP 3.5X10 (GAUZE/BANDAGES/DRESSINGS) ×3
DURAPREP 26ML APPLICATOR (WOUND CARE) ×6 IMPLANT
ELECT REM PT RETURN 15FT ADLT (MISCELLANEOUS) ×3 IMPLANT
GLOVE BIOGEL M 7.0 STRL (GLOVE) IMPLANT
GLOVE BIOGEL PI IND STRL 7.5 (GLOVE) ×1 IMPLANT
GLOVE BIOGEL PI IND STRL 8.5 (GLOVE) ×1 IMPLANT
GLOVE BIOGEL PI INDICATOR 7.5 (GLOVE) ×10
GLOVE BIOGEL PI INDICATOR 8.5 (GLOVE) ×2
GLOVE ECLIPSE 8.0 STRL XLNG CF (GLOVE) ×5 IMPLANT
GLOVE ORTHO TXT STRL SZ7.5 (GLOVE) ×4 IMPLANT
GOWN STRL REUS W/TWL LRG LVL3 (GOWN DISPOSABLE) ×7 IMPLANT
GOWN STRL REUS W/TWL XL LVL3 (GOWN DISPOSABLE) ×3 IMPLANT
HANDPIECE INTERPULSE COAX TIP (DISPOSABLE) ×3
MANIFOLD NEPTUNE II (INSTRUMENTS) ×3 IMPLANT
PACK TOTAL KNEE CUSTOM (KITS) ×3 IMPLANT
POSITIONER SURGICAL ARM (MISCELLANEOUS) ×3 IMPLANT
SET HNDPC FAN SPRY TIP SCT (DISPOSABLE) ×1 IMPLANT
SET PAD KNEE POSITIONER (MISCELLANEOUS) ×3 IMPLANT
SUT MNCRL AB 4-0 PS2 18 (SUTURE) ×3 IMPLANT
SUT STRATAFIX 0 PDS 27 VIOLET (SUTURE) ×3
SUT VIC AB 1 CT1 36 (SUTURE) ×3 IMPLANT
SUT VIC AB 2-0 CT1 27 (SUTURE) ×9
SUT VIC AB 2-0 CT1 TAPERPNT 27 (SUTURE) ×3 IMPLANT
SUTURE STRATFX 0 PDS 27 VIOLET (SUTURE) ×1 IMPLANT
SYR 50ML LL SCALE MARK (SYRINGE) ×1 IMPLANT
TRAY FOLEY W/METER SILVER 16FR (SET/KITS/TRAYS/PACK) ×3 IMPLANT
WATER STERILE IRR 1000ML POUR (IV SOLUTION) ×5 IMPLANT
WRAP KNEE MAXI GEL POST OP (GAUZE/BANDAGES/DRESSINGS) ×3 IMPLANT
YANKAUER SUCT BULB TIP 10FT TU (MISCELLANEOUS) ×3 IMPLANT

## 2017-08-19 NOTE — Progress Notes (Signed)
PT Cancellation Note  Patient Details Name: Robert Sutton MRN: [id number] DOB: 06/18/1946   Cancelled Treatment:    Reason Eval/Treat Not Completed: Medical issues which prohibited therapy. Pt to ICU after surgery. Will hold PT and check back another day. THanks.    Weston Anna, MPT Pager: [phone]

## 2017-08-19 NOTE — Anesthesia Preprocedure Evaluation (Addendum)
Anesthesia Evaluation  Patient identified by MRN, date of birth, ID band Patient awake    Reviewed: Allergy & Precautions, NPO status , Patient's Chart, lab work & pertinent test results  Airway Mallampati: II  TM Distance: >3 FB Neck ROM: Full    Dental no notable dental hx.    Pulmonary neg pulmonary ROS, sleep apnea , former smoker,    Pulmonary exam normal breath sounds clear to auscultation       Cardiovascular hypertension, Pt. on medications negative cardio ROS Normal cardiovascular exam Rhythm:Regular Rate:Normal     Neuro/Psych  Headaches, Anxiety Depression negative neurological ROS  negative psych ROS   GI/Hepatic negative GI ROS, Neg liver ROS,   Endo/Other  negative endocrine ROSdiabetes, Type 2, Insulin DependentMorbid obesity  Renal/GU negative Renal ROS  negative genitourinary   Musculoskeletal negative musculoskeletal ROS (+)   Abdominal (+) + obese,   Peds negative pediatric ROS (+)  Hematology negative hematology ROS (+)   Anesthesia Other Findings EF 20-25%  Reproductive/Obstetrics negative OB ROS                            Anesthesia Physical Anesthesia Plan  ASA: III  Anesthesia Plan: Regional and General   Post-op Pain Management:  Regional for Post-op pain and GA combined w/ Regional for post-op pain   Induction: Intravenous  PONV Risk Score and Plan: 1 and Ondansetron  Airway Management Planned: LMA  Additional Equipment: Arterial line  Intra-op Plan:   Post-operative Plan:   Informed Consent: I have reviewed the patients History and Physical, chart, labs and discussed the procedure including the risks, benefits and alternatives for the proposed anesthesia with the patient or authorized representative who has indicated his/her understanding and acceptance.   Dental advisory given  Plan Discussed with: CRNA  Anesthesia Plan Comments:        Anesthesia Quick Evaluation

## 2017-08-19 NOTE — Anesthesia Procedure Notes (Signed)
Arterial Line Insertion Start/End1/03/2018 8:00 AM Performed by: Lynda Rainwater, MD, anesthesiologist  Patient location: OR. Preanesthetic checklist: patient identified, IV checked, site marked, risks and benefits discussed, surgical consent, monitors and equipment checked, pre-op evaluation, timeout performed and anesthesia consent Lidocaine 1% used for infiltration Right, radial was placed Catheter size: 20 Fr Hand hygiene performed  and maximum sterile barriers used   Attempts: 5 or more Procedure performed without using ultrasound guided technique. Following insertion, dressing applied. Post procedure assessment: normal and unchanged  Patient tolerated the procedure well with no immediate complications.

## 2017-08-19 NOTE — Interval H&P Note (Signed)
History and Physical Interval Note:  08/19/2017 7:04 AM  Robert Sutton  has presented today for surgery, with the diagnosis of Left knee osteoarthritis  The various methods of treatment have been discussed with the patient and family. After consideration of risks, benefits and other options for treatment, the patient has consented to  Procedure(s) with comments: LEFT TOTAL  KNEE ARTHROPLASTY (Left) - 70 mins as a surgical intervention .  The patient's history has been reviewed, patient examined, no change in status, stable for surgery.  I have reviewed the patient's chart and labs.  Questions were answered to the patient's satisfaction.     Mauri Pole

## 2017-08-19 NOTE — Anesthesia Procedure Notes (Signed)
Anesthesia Regional Block: Adductor canal block   Pre-Anesthetic Checklist: ,, timeout performed, Correct Patient, Correct Site, Correct Laterality, Correct Procedure, Correct Position, site marked, Risks and benefits discussed,  Surgical consent,  Pre-op evaluation,  At surgeon's request and post-op pain management  Laterality: Left  Prep: chloraprep       Needles:  Injection technique: Single-shot  Needle Type: Stimiplex     Needle Length: 9cm  Needle Gauge: 21     Additional Needles:   Procedures:,,,, ultrasound used (permanent image in chart),,,,  Narrative:  Start time: 08/19/2017 7:30 AM End time: 08/19/2017 7:35 AM Injection made incrementally with aspirations every 5 mL.  Performed by: Personally  Anesthesiologist: Lynda Rainwater, MD

## 2017-08-19 NOTE — Op Note (Signed)
NAME:  Rolling Fields RECORD NO.:  [id number]                             FACILITY:  [facility]      PHYSICIAN:  Pietro Cassis. Alvan Dame, M.D.  DATE OF BIRTH:  [birth year]      DATE OF PROCEDURE:  08/19/2017                                     OPERATIVE REPORT         PREOPERATIVE DIAGNOSIS:  Left knee osteoarthritis.      POSTOPERATIVE DIAGNOSIS:  Left knee osteoarthritis.      FINDINGS:  The patient was noted to have complete loss of cartilage and   bone-on-bone arthritis with associated osteophytes in the medial and patellofemoral compartments of   the knee with a significant synovitis and associated effusion.      PROCEDURE:  Left total knee replacement.      COMPONENTS USED:  DePuy Attune rotating platform posterior stabilized knee   system, a size 8 femur, 8 tibia, size 7 mm PS AOX insert, and 41 anatomic patellar   button.      SURGEON:  Pietro Cassis. Alvan Dame, M.D.      ASSISTANT:  Danae Orleans, PA-C.      ANESTHESIA:  General and Regional.      SPECIMENS:  None.      COMPLICATION:  None.      DRAINS:  None.  EBL: <400cc      TOURNIQUET TIME:   Total Tourniquet Time Documented: Thigh (Left) - 3 minutes Total: Thigh (Left) - 3 minutes  .      The patient was stable to the recovery room.      INDICATION FOR PROCEDURE:  GASPAR FOWLE is a 72 y.o. male patient of   mine.  The patient had been seen, evaluated, and treated conservatively in the   office with medication, activity modification, and injections.  The patient had   radiographic changes of bone-on-bone arthritis with endplate sclerosis and osteophytes noted.      The patient failed conservative measures including medication, injections, and activity modification, and at this point was ready for more definitive measures.   Based on the radiographic changes and failed conservative measures, the patient   decided to proceed with total knee replacement.  Risks of infection,   DVT, component  failure, need for revision surgery, postop course, and   expectations were all   discussed and reviewed.  Consent was obtained for benefit of pain   relief.      PROCEDURE IN DETAIL:  The patient was brought to the operative theater.   Once adequate anesthesia, preoperative antibiotics, 2 gm of Ancef, 1 gm of Tranexamic Acid, and 10 mg of Decadron administered, the patient was positioned supine with the left thigh tourniquet placed.  The  left lower extremity was prepped and draped in sterile fashion.  A time-   out was performed identifying the patient, planned procedure, and   extremity.      The left lower extremity was placed in the [facility] leg holder.  The tourniquet was noted to have a leak.  I decided to proceed as tourniquetless TKR with  a sterile tourniquet used for cementing only.  A midline incision was   made followed by median parapatellar arthrotomy.  Following initial   exposure, attention was first directed to the patella.  Precut   measurement was noted to be 26 mm.  I resected down to 14-15 mm and used a   41 anatomic patellar button to restore patellar height as well as cover the cut   surface.      The lug holes were drilled and a metal shim was placed to protect the   patella from retractors and saw blades.      At this point, attention was now directed to the femur.  The femoral   canal was opened with a drill, irrigated to try to prevent fat emboli.  An   intramedullary rod was passed at 5 degrees valgus, 9 mm of bone was   resected off the distal femur.  Following this resection, the tibia was   subluxated anteriorly.  Using the extramedullary guide, 2 mm of bone was resected off   the proximal medial tibia.  We confirmed the gap would be   stable medially and laterally with a size 6 spacer block as well as confirmed   the cut was perpendicular in the coronal plane, checking with an alignment rod.      Once this was done, I sized the femur to be a size 8 in the  anterior-   posterior dimension, chose a standard component based on medial and   lateral dimension.  The size 8 rotation block was then pinned in   position anterior referenced using the C-clamp to set rotation.  The   anterior, posterior, and  chamfer cuts were made without difficulty nor   notching making certain that I was along the anterior cortex to help   with flexion gap stability.      The final box cut was made off the lateral aspect of distal femur.      At this point, the tibia was sized to be a size 8, the size 8 tray was   then pinned in position through the medial third of the tubercle,   drilled, and keel punched.  Trial reduction was now carried with a 8 femur,  8 tibia, a size 6 then 7 mm PS insert, and the 41 anatomic patella botton.  The knee was brought to   extension, full extension with good flexion stability with the patella   tracking through the trochlea without application of pressure.  Given   all these findings the femoral lug holes were drilled and then the trial components removed.  Final components were   opened and cement was mixed.  The knee was irrigated with normal saline   solution and pulse lavage.  The synovial lining was   then injected with 30 cc of 0.25% Marcaine with epinephrine and 1 cc of Toradol plus 30 cc of NS for a total of 61 cc.      The knee was irrigated.  Final implants were then cemented onto clean and   dried cut surfaces of bone with the knee brought to extension with a size 7 mm PS trial insert.      Once the cement had fully cured, the excess cement was removed   throughout the knee.  I confirmed I was satisfied with the range of   motion and stability, and the final size 7 mm PS AOX insert was chosen.  It was  placed into the knee.      The tourniquet had been let down at 3 minutes.  No significant   hemostasis required.  The   extensor mechanism was then reapproximated using #1 Vicryl and #1 stratafix sutures with the knee    in flexion.  The   remaining wound was closed with 2-0 Vicryl and running 4-0 Monocryl.   The knee was cleaned, dried, dressed sterilely using Dermabond and   Aquacel dressing.  The patient was then   brought to recovery room in stable condition, tolerating the procedure   well.   Please note that Physician Assistant, Clement Husbands, PA-C, was present for the entirety of the case, and was utilized for pre-operative positioning, peri-operative retractor management, general facilitation of the procedure.  He was also utilized for primary wound closure at the end of the case.              Pietro Cassis Alvan Dame, M.D.    08/19/2017 9:21 AM

## 2017-08-19 NOTE — Addendum Note (Signed)
Addendum  created 08/19/17 1155 by [place] Rayona Sardinha C, CRNA   Intraprocedure LDAs edited, LDA properties accepted

## 2017-08-19 NOTE — Anesthesia Procedure Notes (Signed)
Procedure Name: LMA Insertion Date/Time: 08/19/2017 7:48 AM Performed by: [place] Mima Cranmore C, CRNA Pre-anesthesia Checklist: Patient identified, Emergency Drugs available, Suction available and Patient being monitored Patient Re-evaluated:Patient Re-evaluated prior to induction Oxygen Delivery Method: Circle system utilized Preoxygenation: Pre-oxygenation with 100% oxygen Induction Type: IV induction Ventilation: Mask ventilation without difficulty LMA: LMA inserted LMA Size: 5.0 Number of attempts: 1 Airway Equipment and Method: Bite block Placement Confirmation: positive ETCO2 Tube secured with: Tape Dental Injury: Teeth and Oropharynx as per pre-operative assessment

## 2017-08-19 NOTE — Progress Notes (Signed)
Patients oxygen saturation, dropped down into the 60s when he removed his nasal cannula to eat dinner. Patient was receiving 3 liters per his baseline needs at home. This RN encouraged the patient to put his nasal cannula back on, but patient refused repeatedly. Patient was educated on the importance of keeping oxygen saturations above 90%. Patient stated that he will put the cannual back on when he is done eating. Patients oxygen saturation dropped back down to the 60s when he removed his nasal cannula while nodding off to sleep later in the shift. This RN placed a simple mask on the patient and turned the oxygen up to 5 liters. Patient's current oxygen saturations are 92% . Report given to oncoming nurse.

## 2017-08-19 NOTE — Anesthesia Postprocedure Evaluation (Signed)
Anesthesia Post Note  Patient: Robert Sutton  Procedure(s) Performed: LEFT TOTAL  KNEE ARTHROPLASTY (Left Knee)     Patient location during evaluation: PACU Anesthesia Type: Regional and General Level of consciousness: oriented and awake and alert Pain management: pain level controlled Vital Signs Assessment: post-procedure vital signs reviewed and stable Respiratory status: spontaneous breathing and respiratory function stable Cardiovascular status: blood pressure returned to baseline and stable Postop Assessment: no headache, no backache and no apparent nausea or vomiting Anesthetic complications: no    Last Vitals:  Vitals:   08/19/17 1130 08/19/17 1135  BP: 113/64   Pulse: 77 80  Resp: 13 (!) 25  Temp:    SpO2: (!) 89% 93%    Last Pain:  Vitals:   08/19/17 1124  TempSrc:   PainSc: 9                  Lynda Rainwater

## 2017-08-19 NOTE — Transfer of Care (Signed)
Immediate Anesthesia Transfer of Care Note  Patient: Robert Sutton  Procedure(s) Performed: LEFT TOTAL  KNEE ARTHROPLASTY (Left Knee)  Patient Location: PACU  Anesthesia Type:General and Regional  Level of Consciousness: awake  Airway & Oxygen Therapy: Patient Spontanous Breathing and non-rebreather face mask  Post-op Assessment: Report given to RN and Post -op Vital signs reviewed and stable  Post vital signs: Reviewed and stable  Last Vitals:  Vitals:   08/19/17 1006 08/19/17 1008  BP: (!) 157/124 (!) 145/63  Pulse: (!) 33 (!) 29  Resp: 13 17  Temp:    SpO2: 93% 96%    Last Pain:  Vitals:   08/19/17 0613  TempSrc: Oral      Patients Stated Pain Goal: 5 (99/37/16 9678)  Complications: No apparent anesthesia complications

## 2017-08-19 NOTE — Discharge Instructions (Signed)

## 2017-08-20 ENCOUNTER — Inpatient Hospital Stay (HOSPITAL_COMMUNITY): Payer: Non-veteran care

## 2017-08-20 ENCOUNTER — Encounter (HOSPITAL_COMMUNITY): Payer: Self-pay | Admitting: Family Medicine

## 2017-08-20 DIAGNOSIS — F419 Anxiety disorder, unspecified: Secondary | ICD-10-CM | POA: Diagnosis present

## 2017-08-20 DIAGNOSIS — Z96659 Presence of unspecified artificial knee joint: Secondary | ICD-10-CM

## 2017-08-20 DIAGNOSIS — G4730 Sleep apnea, unspecified: Secondary | ICD-10-CM | POA: Diagnosis present

## 2017-08-20 DIAGNOSIS — R0902 Hypoxemia: Secondary | ICD-10-CM

## 2017-08-20 DIAGNOSIS — I499 Cardiac arrhythmia, unspecified: Secondary | ICD-10-CM | POA: Diagnosis present

## 2017-08-20 DIAGNOSIS — E119 Type 2 diabetes mellitus without complications: Secondary | ICD-10-CM

## 2017-08-20 DIAGNOSIS — I509 Heart failure, unspecified: Secondary | ICD-10-CM

## 2017-08-20 LAB — BASIC METABOLIC PANEL
Anion gap: 6 (ref 5–15)
BUN: 32 mg/dL — AB (ref 6–20)
CHLORIDE: 96 mmol/L — AB (ref 101–111)
CO2: 35 mmol/L — AB (ref 22–32)
CREATININE: 1.49 mg/dL — AB (ref 0.61–1.24)
Calcium: 8.5 mg/dL — ABNORMAL LOW (ref 8.9–10.3)
GFR calc Af Amer: 52 mL/min — ABNORMAL LOW (ref >60)
GFR calc non Af Amer: 45 mL/min — ABNORMAL LOW (ref >60)
Glucose, Bld: 146 mg/dL — ABNORMAL HIGH (ref 65–99)
Potassium: 4.5 mmol/L (ref 3.5–5.1)
SODIUM: 137 mmol/L (ref 135–145)

## 2017-08-20 LAB — CBC
HEMATOCRIT: 41.9 % (ref 39.0–52.0)
HEMOGLOBIN: 13.4 g/dL (ref 13.0–17.0)
MCH: 32.9 pg (ref 26.0–34.0)
MCHC: 32 g/dL (ref 30.0–36.0)
MCV: 102.9 fL — ABNORMAL HIGH (ref 78.0–100.0)
Platelets: 159 10*3/uL (ref 150–400)
RBC: 4.07 MIL/uL — ABNORMAL LOW (ref 4.22–5.81)
RDW: 15.3 % (ref 11.5–15.5)
WBC: 11.5 10*3/uL — ABNORMAL HIGH (ref 4.0–10.5)

## 2017-08-20 LAB — GLUCOSE, CAPILLARY
GLUCOSE-CAPILLARY: 215 mg/dL — AB (ref 65–99)
GLUCOSE-CAPILLARY: 192 mg/dL — AB (ref 65–99)
Glucose-Capillary: 223 mg/dL — ABNORMAL HIGH (ref 65–99)
Glucose-Capillary: 247 mg/dL — ABNORMAL HIGH (ref 65–99)

## 2017-08-20 LAB — MAGNESIUM: MAGNESIUM: 2 mg/dL (ref 1.7–2.4)

## 2017-08-20 LAB — TROPONIN I
Troponin I: <0.03 ng/mL (ref <0.03)
Troponin I: <0.03 ng/mL (ref <0.03)
Troponin I: <0.03 ng/mL (ref <0.03)

## 2017-08-20 LAB — BRAIN NATRIURETIC PEPTIDE: B NATRIURETIC PEPTIDE 5: 343.6 pg/mL — AB (ref 0.0–100.0)

## 2017-08-20 MED ORDER — ORAL CARE MOUTH RINSE
15.0000 mL | Freq: Two times a day (BID) | OROMUCOSAL | Status: DC
  Administered 2017-08-20 – 2017-08-21 (×2): 15 mL via OROMUCOSAL

## 2017-08-20 NOTE — Care Management Note (Signed)
Case Management Note  Patient Details  Name: Robert Sutton MRN: [id number] Date of Birth: 10/26/1945  Subjective/Objective:                  Left total knee and post op complications, a.line, hfnc,iv robaxin  Action/Plan: Date:  August 20, 2017 Chart reviewed for concurrent status and case management needs.  Will continue to follow patient progress.  Discharge Planning: following for needs  Expected discharge date: January 122019 Velva Harman, BSN, [place], [place]   Expected Discharge Date:                  Expected Discharge Plan:  Home/Self Care  In-House Referral:     Discharge planning Services  CM Consult  Post Acute Care Choice:    Choice offered to:     DME Arranged:    DME Agency:     HH Arranged:    HH Agency:     Status of Service:  In process, will continue to follow  If discussed at Long Length of Stay Meetings, dates discussed:    Additional Comments:  Leeroy Cha, RN 08/20/2017, 9:44 AM

## 2017-08-20 NOTE — Progress Notes (Signed)
Ortho called and made aware of Cardiac ectomy. Mag level recommended. Ortho consulted hospitalist. Will check electrolytes this am.

## 2017-08-20 NOTE — Evaluation (Signed)
Physical Therapy Evaluation Patient Details Name: Robert Sutton MRN: [id number] DOB: 1946/05/31 Today's Date: 08/20/2017   History of Present Illness  Pt s/p L TKR and with hx of R TKR (09) and DM.  Pt admitted to ICU post op 2* cardiac issues.  Clinical Impression  Pt s/p L TKR and presents with decreased L LE strength/ROM, post op pain, and difficulty processing limiting functional mobility.  Pt hopes to progress to dc home with family assist and expresses wish for HHPT follow up.    Follow Up Recommendations DC plan and follow up therapy as arranged by surgeon    Equipment Recommendations  None recommended by PT    Recommendations for Other Services OT consult     Precautions / Restrictions Precautions Precautions: Knee;Fall Required Braces or Orthoses: Knee Immobilizer - Left Knee Immobilizer - Left: Discontinue once straight leg raise with < 10 degree lag Restrictions Weight Bearing Restrictions: No LLE Weight Bearing: Weight bearing as tolerated      Mobility  Bed Mobility Overal bed mobility: Needs Assistance Bed Mobility: Supine to Sit;Sit to Supine     Supine to sit: Mod assist;+2 for physical assistance;+2 for safety/equipment Sit to supine: Mod assist;Max assist;+2 for physical assistance;+2 for safety/equipment   General bed mobility comments: Increased time with cues for sequence and use of R LE to self assist.  Pad under pt utilized to assist pt to EOB.  Transfers Overall transfer level: Needs assistance   Transfers: Sit to/from Stand Sit to Stand: Mod assist;+2 physical assistance;+2 safety/equipment;From elevated surface         General transfer comment: cues for LE management and use of UEs to self assist.  Ambulation/Gait Ambulation/Gait assistance: Mod assist;+2 physical assistance;+2 safety/equipment Ambulation Distance (Feet): 4 Feet Assistive device: Rolling walker (2 wheeled) Gait Pattern/deviations: Step-to pattern;Decreased step length -  right;Decreased step length - left;Shuffle;Trunk flexed Gait velocity: decr Gait velocity interpretation: Below normal speed for age/gender General Gait Details: Pt side stepped up side of bed.  Ambulation not attempted for safety 2* pt struggling to process and follow cues.  Stairs            Wheelchair Mobility    Modified Rankin (Stroke Patients Only)       Balance Overall balance assessment: Needs assistance Sitting-balance support: Feet supported;No upper extremity supported Sitting balance-Leahy Scale: Good     Standing balance support: Bilateral upper extremity supported Standing balance-Leahy Scale: Poor Standing balance comment: R lean                             Pertinent Vitals/Pain Pain Assessment: Faces Faces Pain Scale: Hurts little more Pain Location: L knee Pain Descriptors / Indicators: Sore Pain Intervention(s): Limited activity within patient's tolerance;Monitored during session;Premedicated before session;Ice applied    Home Living Family/patient expects to be discharged to:: Private residence Living Arrangements: Spouse/significant other Available Help at Discharge: Family Type of Home: House Home Access: Stairs to enter Entrance Stairs-Rails: None Entrance Stairs-Number of Steps: 5 Home Layout: Able to live on main level with bedroom/bathroom Home Equipment: Walker - 2 wheels;Cane - single point;Crutches;Wheelchair - manual      Prior Function Level of Independence: Independent               Hand Dominance        Extremity/Trunk Assessment   Upper Extremity Assessment Upper Extremity Assessment: Overall WFL for tasks assessed    Lower Extremity Assessment Lower Extremity  Assessment: LLE deficits/detail LLE Deficits / Details: AAROM at knee -10-  45.  Quad strength ~2-/5    Cervical / Trunk Assessment Cervical / Trunk Assessment: Normal  Communication   Communication: No difficulties  Cognition  Arousal/Alertness: Lethargic;Suspect due to medications Behavior During Therapy: Flat affect;Impulsive Overall Cognitive Status: No family/caregiver present to determine baseline cognitive functioning                                 General Comments: Pt slow to process and follow cues      General Comments      Exercises Total Joint Exercises Ankle Circles/Pumps: Both;15 reps;Supine;AAROM Quad Sets: AAROM;AROM;Both;10 reps;Supine Heel Slides: AAROM;Left;15 reps;Supine Straight Leg Raises: AAROM;Left;10 reps;Supine   Assessment/Plan    PT Assessment Patient needs continued PT services  PT Problem List Decreased strength;Decreased range of motion;Decreased activity tolerance;Decreased balance;Decreased mobility;Decreased cognition;Decreased knowledge of use of DME;Obesity;Pain       PT Treatment Interventions DME instruction;Gait training;Stair training;Functional mobility training;Therapeutic activities;Therapeutic exercise;Patient/family education    PT Goals (Current goals can be found in the Care Plan section)  Acute Rehab PT Goals Patient Stated Goal: Regain IND PT Goal Formulation: With patient Time For Goal Achievement: 08/27/17 Potential to Achieve Goals: Fair    Frequency 7X/week   Barriers to discharge        Co-evaluation               AM-PAC PT "6 Clicks" Daily Activity  Outcome Measure Difficulty turning over in bed (including adjusting bedclothes, sheets and blankets)?: Unable Difficulty moving from lying on back to sitting on the side of the bed? : Unable Difficulty sitting down on and standing up from a chair with arms (e.g., wheelchair, bedside commode, etc,.)?: Unable Help needed moving to and from a bed to chair (including a wheelchair)?: A Lot Help needed walking in hospital room?: A Lot Help needed climbing 3-5 steps with a railing? : Total 6 Click Score: 8    End of Session Equipment Utilized During Treatment: Gait belt;Left  knee immobilizer Activity Tolerance: Patient limited by fatigue Patient left: in bed;with call bell/phone within reach;with nursing/sitter in room Nurse Communication: Mobility status PT Visit Diagnosis: Pain;Difficulty in walking, not elsewhere classified (R26.2);Unsteadiness on feet (R26.81) Pain - Right/Left: Left Pain - part of body: Knee    Time: 9470-9628 PT Time Calculation (min) (ACUTE ONLY): 57 min   Charges:   PT Evaluation $PT Eval Moderate Complexity: 1 Mod PT Treatments $Gait Training: 23-37 mins $Therapeutic Activity: 23-37 mins   PT G Codes:        Pg [phone]   Keiona Jenison 08/20/2017, 12:22 PM

## 2017-08-20 NOTE — Consult Note (Signed)
History and Physical    Robert Sutton BTD:[id number] DOB: 1946/04/12 DOA: 08/19/2017  PCP: Raquel James, MD  Reason for consult:  trigiminy and hypoxia Consulting team:  Orthopedic surgery  HPI: Robert Sutton is a 72 y.o. male with medical history significant of CHF he reports EF 20% sees cardiology at [facility] and reports he is suppose to get an AICD placed, DM, sleep apnea is s/p left TKR yesterday had an episode of trigiminy and hypoxia last night while sleeping.  He is up in bed drinking some coffee.  He is not very cooperative with questions.  He denies any sob.  Denies chest pain.  Has not had any fevers.  Oxygen sats are normal now while awake.  No cough.  Review of Systems: As per HPI otherwise 10 point review of systems negative.   Past Medical History:  Diagnosis Date  . Anxiety   . Cancer [facility]) 2009   Prostate  . CHF (congestive heart failure) ([place])   . Depression   . Diabetes mellitus without complication ([place])   . Dyspnea   . Dysrhythmia   . Headache   . Hypertension   . Sleep apnea     Past Surgical History:  Procedure Laterality Date  . JOINT REPLACEMENT  2009   R Knee replacement  . TONSILLECTOMY    . TOTAL KNEE ARTHROPLASTY Left 08/19/2017   Procedure: LEFT TOTAL  KNEE ARTHROPLASTY;  Surgeon: Paralee Cancel, MD;  Location: [facility] ORS;  Service: Orthopedics;  Laterality: Left;  70 mins     reports that he has quit smoking. he has never used smokeless tobacco. He reports that he drinks alcohol. He reports that he does not use drugs.  No Known Allergies  History reviewed. No pertinent family history. no premature CAD  Prior to Admission medications   Medication Sig Start Date End Date Taking? Authorizing Provider  acetaminophen (TYLENOL) 500 MG tablet Take 1,000 mg by mouth every 6 (six) hours as needed for moderate pain or headache.   Yes [provider]  alprazolam Duanne Moron) 2 MG tablet Take 2 mg by mouth 2 (two) times daily as needed for anxiety.   Yes  [provider]  amLODipine (NORVASC) 5 MG tablet Take 5 mg by mouth 2 (two) times daily.    Yes [provider]  apixaban (ELIQUIS) 5 MG TABS tablet Take 5 mg by mouth 2 (two) times daily.   Yes [provider]  fluticasone (FLONASE) 50 MCG/ACT nasal spray Place 1 spray into both nostrils 2 (two) times daily.   Yes [provider]  furosemide (LASIX) 80 MG tablet Take 80-120 mg by mouth See admin instructions. Take 120 mg by mouth in the morning and take 80 mg by mouth in the early evening   Yes [provider]  gabapentin (NEURONTIN) 300 MG capsule Take 600 mg by mouth 3 (three) times daily.   Yes [provider]  glipiZIDE (GLUCOTROL) 10 MG tablet Take 10 mg by mouth 2 (two) times daily.   Yes [provider]  insulin glargine (LANTUS) 100 unit/mL SOPN Inject 30 Units into the skin at bedtime.   Yes [provider]  lisinopril (PRINIVIL,ZESTRIL) 40 MG tablet Take 20 mg by mouth 2 (two) times daily.   Yes [provider]  magnesium oxide (MAG-OX) 400 MG tablet Take 400 mg by mouth 2 (two) times daily.   Yes [provider]  omeprazole (PRILOSEC) 20 MG capsule Take 20 mg by mouth 2 (two)  times daily.   Yes [provider]  Polyvinyl Alcohol (LIQUID TEARS OP) Place 1-2 drops into both eyes daily as needed (for dry eyes).   Yes [provider]  potassium chloride SA (K-DUR,KLOR-CON) 20 MEQ tablet Take 20-40 mEq by mouth See admin instructions. Take 40 mg by mouth in the morning and take 20 mg by mouth in the early evening   Yes [provider]  vitamin B-12 (CYANOCOBALAMIN) 1000 MCG tablet Take 1,000 mcg by mouth daily.   Yes [provider]  albuterol (PROVENTIL HFA;VENTOLIN HFA) 108 (90 Base) MCG/ACT inhaler Inhale 1 puff into the lungs every 6 (six) hours as needed for wheezing or shortness of breath.    [provider]  docusate sodium (COLACE) 100 MG capsule Take 1  capsule (100 mg total) by mouth 2 (two) times daily. 08/19/17   Danae Orleans, PA-C  ferrous sulfate (FERROUSUL) 325 (65 FE) MG tablet Take 1 tablet (325 mg total) by mouth 3 (three) times daily with meals. 08/19/17   Danae Orleans, PA-C  HYDROcodone-acetaminophen (NORCO) 7.5-325 MG tablet Take 1-2 tablets by mouth every 4 (four) hours as needed for moderate pain or severe pain. 08/19/17   Danae Orleans, PA-C  methocarbamol (ROBAXIN) 500 MG tablet Take 1 tablet (500 mg total) by mouth every 6 (six) hours as needed for muscle spasms. 08/19/17   Danae Orleans, PA-C  polyethylene glycol (MIRALAX / GLYCOLAX) packet Take 17 g by mouth 2 (two) times daily. 08/19/17   Danae Orleans, PA-C    Physical Exam: Vitals:   08/20/17 0400 08/20/17 0448 08/20/17 0645 08/20/17 0800  BP: (!) 130/58   129/65  Pulse: 72   75  Resp: 14     Temp: 98.5 F (36.9 C)   98.1 F (36.7 C)  TempSrc: Axillary   Oral  SpO2: (!) 88% 90% 100% 92%  Weight:      Height:          Constitutional: NAD, calm, comfortable Vitals:   08/20/17 0400 08/20/17 0448 08/20/17 0645 08/20/17 0800  BP: (!) 130/58   129/65  Pulse: 72   75  Resp: 14     Temp: 98.5 F (36.9 C)   98.1 F (36.7 C)  TempSrc: Axillary   Oral  SpO2: (!) 88% 90% 100% 92%  Weight:      Height:       Eyes: PERRL, lids and conjunctivae normal ENMT: Mucous membranes are moist. Posterior pharynx clear of any exudate or lesions.Normal dentition.  Neck: normal, supple, no masses, no thyromegaly Respiratory: clear to auscultation bilaterally, no wheezing, no crackles. Normal respiratory effort. No accessory muscle use.  Cardiovascular: Regular rate and rhythm, no murmurs / rubs / gallops. No extremity edema. 2+ pedal pulses. No carotid bruits.  Abdomen: no tenderness, no masses palpated. No hepatosplenomegaly. Bowel sounds positive.  Musculoskeletal: no clubbing / cyanosis. No joint deformity upper and lower extremities x lle. Good ROM, no contractures.  Normal muscle tone.  Skin: no rashes, lesions, ulcers. No induration Neurologic: CN 2-12 grossly intact. Sensation intact, DTR normal. Strength 5/5 in all 4.  Psychiatric: Normal judgment and insight. Alert and oriented x 3. Normal mood.    Labs on Admission: I have personally reviewed following labs and imaging studies  CBC: Recent Labs  Lab 08/19/17 0820 08/20/17 0304  WBC 13.6* 11.5*  NEUTROABS 9.3*  --   HGB 15.4 13.4  HCT 46.1 41.9  MCV 99.1 102.9*  PLT 187 956   Basic Metabolic  Panel: Recent Labs  Lab 08/14/17 1528 08/20/17 0304  NA 138 137  K 4.3 4.5  CL 97* 96*  CO2 33* 35*  GLUCOSE 119* 146*  BUN 21* 32*  CREATININE 1.29* 1.49*  CALCIUM 9.2 8.5*  MG  --  2.0   GFR: Estimated Creatinine Clearance: 62 mL/min (A) (by C-G formula based on SCr of 1.49 mg/dL (H)). Liver Function Tests: No results for input(s): AST, ALT, ALKPHOS, BILITOT, PROT, ALBUMIN in the last 168 hours. No results for input(s): LIPASE, AMYLASE in the last 168 hours. No results for input(s): AMMONIA in the last 168 hours. Coagulation Profile: No results for input(s): INR, PROTIME in the last 168 hours. Cardiac Enzymes: No results for input(s): CKTOTAL, CKMB, CKMBINDEX, TROPONINI in the last 168 hours. BNP (last 3 results) No results for input(s): PROBNP in the last 8760 hours. HbA1C: No results for input(s): HGBA1C in the last 72 hours. CBG: Recent Labs  Lab 08/19/17 0619 08/19/17 1023 08/19/17 1643 08/19/17 2111 08/20/17 0802  GLUCAP 146* 170* 170* 228* 223*   Lipid Profile: No results for input(s): CHOL, HDL, LDLCALC, TRIG, CHOLHDL, LDLDIRECT in the last 72 hours. Thyroid Function Tests: No results for input(s): TSH, T4TOTAL, FREET4, T3FREE, THYROIDAB in the last 72 hours. Anemia Panel: No results for input(s): VITAMINB12, FOLATE, FERRITIN, TIBC, IRON, RETICCTPCT in the last 72 hours. Urine analysis: No results found for: COLORURINE, APPEARANCEUR, LABSPEC, PHURINE, GLUCOSEU,  HGBUR, BILIRUBINUR, KETONESUR, PROTEINUR, UROBILINOGEN, NITRITE, LEUKOCYTESUR Sepsis Labs: !!!!!!!!!!!!!!!!!!!!!!!!!!!!!!!!!!!!!!!!!!!! @LABRCNTIP (procalcitonin:4,lacticidven:4) ) Recent Results (from the past 240 hour(s))  Surgical pcr screen     Status: Abnormal   Collection Time: 08/14/17  2:45 PM  Result Value Ref Range Status   MRSA, PCR NEGATIVE NEGATIVE Final   Staphylococcus aureus POSITIVE (A) NEGATIVE Final    Comment: (NOTE) The Xpert SA Assay (FDA approved for NASAL specimens in patients 75 years of age and older), is one component of a comprehensive surveillance program. It is not intended to diagnose infection nor to guide or monitor treatment.      Radiological Exams on Admission: Dg Chest Port 1 View  Result Date: 08/20/2017 CLINICAL DATA:  Hypoxia. EXAM: PORTABLE CHEST 1 VIEW COMPARISON:  None. FINDINGS: Low lung volumes limit assessment. The heart is enlarged. Bronchovascular crowding with possible perihilar edema. Patchy bibasilar opacities. No large pleural effusion. No pneumothorax. IMPRESSION: Low lung volumes with bronchovascular crowding. Possible central pulmonary edema. Patchy bibasilar opacities may be atelectasis or aspiration. Electronically Signed   By: Jeb Levering M.D.   On: 08/20/2017 06:19    EKG: pending cxr reviewed with mild possible. edema    Assessment/Plan 72 yo male s/p left TKR with episode of hypoxia while sleeping and arrythmia  Principal Problem:   S/P left TKA- per ortho.  POD 1.  Pt anticoagulated postop.  Active Problems:   Diabetes mellitus without complication ([place])- ssi   Anxiety- noted   CHF (congestive heart failure) ([place])- he reports EF of 20% and reports he is suppose to be getting a defibrillater placed.  Obtain records from his cardiologist.  Obtain cardiac echo here.  Ck trop and bnp.  Ck 12 lead ekg   Sleep apnea- untreated, may be causing hypoxia while sleeping.  Will need outpt sleep study    Dysrhythmia- mag 2.   Monitor.  Ck 12 lead  Will follow along with you daily.  DVT prophylaxis: eloquis Code Status:  full Family Communication:  none Disposition Plan:  Per ortho team    Calleigh Lafontant A MD  [facility]  If 7PM-7AM, please contact night-coverage [email or web address] Password [email or web address]  08/20/2017, 9:01 AM

## 2017-08-20 NOTE — Progress Notes (Signed)
Pt refused CPAP qhs.  Discussed with Pt which mask we had available (FFM and NM) and he stated he was too claustrophobic to wear either.  Pt is agreeable to re-evaluating if he starts desaturating throughout the night.

## 2017-08-20 NOTE — Progress Notes (Signed)
     Subjective: 1 Day Post-Op Procedure(s) (LRB): LEFT TOTAL  KNEE ARTHROPLASTY (Left)   Patient reports pain as mild, pain controlled.  Trigeminy last night as well as SOB.  Patient is dependent on O2 and SOB is normal per patient.    Objective:   VITALS:   Vitals:   08/20/17 0800 08/20/17 1031  BP: 129/65 (!) 140/92  Pulse: 75   Resp:    Temp: 98.1 F (36.7 C)   SpO2: 92%     Dorsiflexion/Plantar flexion intact Incision: dressing C/D/I No cellulitis present Compartment soft  LABS Recent Labs    08/19/17 0820 08/20/17 0304  HGB 15.4 13.4  HCT 46.1 41.9  WBC 13.6* 11.5*  PLT 187 159    Recent Labs    08/20/17 0304  NA 137  K 4.5  BUN 32*  CREATININE 1.49*  GLUCOSE 146*     Assessment/Plan: 1 Day Post-Op Procedure(s) (LRB): LEFT TOTAL  KNEE ARTHROPLASTY (Left) Foley cath to be d/c'ed Advance diet Up with therapy D/C IV fluids Discharge home when ready, possibly tomorrow if no major workup planned.   [place]   PAC  08/20/2017, 1:55 PM

## 2017-08-20 NOTE — Progress Notes (Signed)
Small bag with 1 whole and 3 halves white pills at [facility] during shift assessment. Pt keeping meds in bin located on BS table. Per Pt these are Xanax. Pt denied taking any of these meds and had them in case he "needed" them. RN explained that we had these available to give them to him if needed but that he could not have them at [facility]  Educated pt on policy about home meds and he decided we will send these meds home with wife when she comes to visit, instead of having them taken down to pharmacy. Wife called and RN notified her of this issue and she said she would take care of it.  Bag given to wife at about 1300H, who also recognized these as Xanax.   Will continue to monitor.

## 2017-08-20 NOTE — Progress Notes (Signed)
Notified Hospitalist APP of run of trigemeny. Suggested mag level. O2 increased. Pt asymptomatic. Will continue to monitor.

## 2017-08-20 NOTE — Progress Notes (Signed)
Echo Tech at bedside, patient said that "he had ECHO done at [facility] not tool long ago." Notified MD Shanon Brow in regards to patient concern. MD Shanon Brow said [facility]. MD Shanon Brow ordered to not complete ECHO since we had [facility]. Patient updated.

## 2017-08-21 ENCOUNTER — Other Ambulatory Visit (HOSPITAL_COMMUNITY): Payer: No Typology Code available for payment source

## 2017-08-21 DIAGNOSIS — I5022 Chronic systolic (congestive) heart failure: Secondary | ICD-10-CM

## 2017-08-21 DIAGNOSIS — R0902 Hypoxemia: Secondary | ICD-10-CM

## 2017-08-21 DIAGNOSIS — F419 Anxiety disorder, unspecified: Secondary | ICD-10-CM

## 2017-08-21 DIAGNOSIS — Z96652 Presence of left artificial knee joint: Secondary | ICD-10-CM

## 2017-08-21 DIAGNOSIS — I499 Cardiac arrhythmia, unspecified: Secondary | ICD-10-CM

## 2017-08-21 DIAGNOSIS — G4730 Sleep apnea, unspecified: Secondary | ICD-10-CM

## 2017-08-21 LAB — GLUCOSE, CAPILLARY
Glucose-Capillary: 108 mg/dL — ABNORMAL HIGH (ref 65–99)
Glucose-Capillary: 78 mg/dL (ref 65–99)
Glucose-Capillary: 67 mg/dL (ref 65–99)
Glucose-Capillary: 185 mg/dL — ABNORMAL HIGH (ref 65–99)

## 2017-08-21 LAB — BASIC METABOLIC PANEL
ANION GAP: 7 (ref 5–15)
BUN: 29 mg/dL — ABNORMAL HIGH (ref 6–20)
CHLORIDE: 95 mmol/L — AB (ref 101–111)
CO2: 34 mmol/L — AB (ref 22–32)
Calcium: 8.6 mg/dL — ABNORMAL LOW (ref 8.9–10.3)
Creatinine, Ser: 1.28 mg/dL — ABNORMAL HIGH (ref 0.61–1.24)
GFR calc non Af Amer: 54 mL/min — ABNORMAL LOW (ref >60)
GLUCOSE: 136 mg/dL — AB (ref 65–99)
POTASSIUM: 4.4 mmol/L (ref 3.5–5.1)
Sodium: 136 mmol/L (ref 135–145)

## 2017-08-21 LAB — CBC
HEMATOCRIT: 35.6 % — AB (ref 39.0–52.0)
HEMOGLOBIN: 11.7 g/dL — AB (ref 13.0–17.0)
MCH: 32.6 pg (ref 26.0–34.0)
MCHC: 32.9 g/dL (ref 30.0–36.0)
MCV: 99.2 fL (ref 78.0–100.0)
Platelets: 130 10*3/uL — ABNORMAL LOW (ref 150–400)
RBC: 3.59 MIL/uL — ABNORMAL LOW (ref 4.22–5.81)
RDW: 14.5 % (ref 11.5–15.5)
WBC: 10.6 10*3/uL — AB (ref 4.0–10.5)

## 2017-08-21 MED ORDER — LORAZEPAM 2 MG/ML IJ SOLN
INTRAMUSCULAR | Status: AC
  Filled 2017-08-21: qty 1

## 2017-08-21 MED ORDER — ALPRAZOLAM 1 MG PO TABS
2.0000 mg | ORAL_TABLET | Freq: Two times a day (BID) | ORAL | Status: DC | PRN
  Administered 2017-08-22: 1 mg via ORAL
  Filled 2017-08-21: qty 2

## 2017-08-21 MED ORDER — TRAMADOL HCL 50 MG PO TABS
50.0000 mg | ORAL_TABLET | Freq: Four times a day (QID) | ORAL | Status: DC | PRN
  Administered 2017-08-22: 05:00:00 50 mg via ORAL
  Filled 2017-08-21: qty 1

## 2017-08-21 MED ORDER — ACETAMINOPHEN 500 MG PO TABS
1000.0000 mg | ORAL_TABLET | Freq: Four times a day (QID) | ORAL | Status: DC
  Administered 2017-08-21 – 2017-08-22 (×4): 1000 mg via ORAL
  Filled 2017-08-21 (×4): qty 2

## 2017-08-21 MED ORDER — LORAZEPAM 2 MG/ML IJ SOLN: 1.0000 mg | Freq: Four times a day (QID) | INTRAMUSCULAR | Status: DC | PRN

## 2017-08-21 MED ORDER — TRAMADOL HCL 50 MG PO TABS
50.0000 mg | ORAL_TABLET | Freq: Four times a day (QID) | ORAL | Status: DC | PRN
Start: 2017-08-21 — End: 2017-08-21
  Administered 2017-08-21: 100 mg via ORAL
  Filled 2017-08-21: qty 2

## 2017-08-21 MED ORDER — LORAZEPAM 2 MG/ML IJ SOLN
1.0000 mg | Freq: Once | INTRAMUSCULAR | Status: AC
  Administered 2017-08-21: 1 mg via INTRAVENOUS

## 2017-08-21 NOTE — Progress Notes (Signed)
OT Cancellation Note  Patient Details Name: Robert Sutton MRN: [id number] DOB: 10-10-1945   Cancelled Treatment:    Reason Eval/Treat Not Completed: Other (comment).  Pt confused. Will check back.  [name] 08/21/2017, 12:06 PM  Lesle Chris, OTR/L [phone] 08/21/2017

## 2017-08-21 NOTE — Progress Notes (Signed)
Physical Therapy Treatment Patient Details Name: Robert Sutton MRN: [id number] DOB: Dec 14, 1945 Today's Date: 08/21/2017    History of Present Illness Pt s/p L TKR and with hx of R TKR (09) and DM.  Pt admitted to ICU post op 2* cardiac issues.    PT Comments    POD # 2 am session Pt arrived to 6th floor from ICU.  On BSC with nursing.  Increased difficulty to get off lower level commode.  + 2 assist from [facility] to EOB.  Attempted to amb however pt insisted he needed to go back to [facility] for BM.  Assisted from elevated bed back to [facility] with 75% VC's on turn completion and safety.  Assisted off BSC to EOB as pt adamantly refused to walk with [place]. "I walked this morning".  Not sure this is accurate as pt also stated he did the stairs.  Assisted back to supine and applied ICE to knee.  Will attempt again later today.     Follow Up Recommendations  DC plan and follow up therapy as arranged by surgeon     Equipment Recommendations  None recommended by PT    Recommendations for Other Services       Precautions / Restrictions Precautions Precautions: Knee;Fall Restrictions Weight Bearing Restrictions: No LLE Weight Bearing: Weight bearing as tolerated    Mobility  Bed Mobility Overal bed mobility: Needs Assistance Bed Mobility: Sit to Supine       Sit to supine: Max assist;+2 for physical assistance;+2 for safety/equipment   General bed mobility comments: support L LE and increased time to complete scooting to HOB/center   Transfers Overall transfer level: Needs assistance Equipment used: Rolling walker (2 wheeled);None Transfers: Sit to/from [facility] Sit to Stand: Mod assist;+2 physical assistance;+2 safety/equipment;From elevated surface Stand pivot transfers: +2 physical assistance;+2 safety/equipment;Mod assist       General transfer comment: 75% VC's on proper hand placement and transfer.  Assisted on/off BSC twice as he insisted he was going to have a BM.     Ambulation/Gait             General Gait Details: pt adamantaly declined to walk reaching for bed rail and pushing walker away.     Stairs            Wheelchair Mobility    Modified Rankin (Stroke Patients Only)       Balance                                            Cognition Arousal/Alertness: Awake/alert                                     General Comments: required MAX encouragement as pt only agreed to what he wanted to do.   Some intermittant confusion and impaired safety cognition.       Exercises      General Comments        Pertinent Vitals/Pain Pain Assessment: Faces Faces Pain Scale: Hurts even more Pain Location: L knee Pain Descriptors / Indicators: Discomfort;Grimacing;Operative site guarding Pain Intervention(s): Monitored during session;Repositioned;Ice applied    Home Living                      Prior Function  PT Goals (current goals can now be found in the care plan section) Progress towards PT goals: Progressing toward goals    Frequency    7X/week      PT Plan Current plan remains appropriate    Co-evaluation              AM-PAC PT "6 Clicks" Daily Activity  Outcome Measure  Difficulty turning over in bed (including adjusting bedclothes, sheets and blankets)?: Unable Difficulty moving from lying on back to sitting on the side of the bed? : Unable Difficulty sitting down on and standing up from a chair with arms (e.g., wheelchair, bedside commode, etc,.)?: Unable Help needed moving to and from a bed to chair (including a wheelchair)?: A Lot Help needed walking in hospital room?: A Lot Help needed climbing 3-5 steps with a railing? : Total 6 Click Score: 8    End of Session Equipment Utilized During Treatment: Gait belt Activity Tolerance: Other (comment)(behavior limited session) Patient left: in bed;with call bell/phone within reach;with  nursing/sitter in room Nurse Communication: Mobility status PT Visit Diagnosis: Pain;Difficulty in walking, not elsewhere classified (R26.2);Unsteadiness on feet (R26.81) Pain - Right/Left: Left Pain - part of body: Knee     Time: 2841-3244 PT Time Calculation (min) (ACUTE ONLY): 30 min  Charges:  $Therapeutic Activity: 23-37 mins                    G Codes:       Rica Koyanagi  PTA [facility]  Acute  Rehab Pager      [phone]

## 2017-08-21 NOTE — Progress Notes (Signed)
Patient ID: Robert Sutton, male   DOB: 02/21/1946, 72 y.o.   MRN: [id number] Subjective: 2 Days Post-Op Procedure(s) (LRB): LEFT TOTAL  KNEE ARTHROPLASTY (Left)    Patient confused this am.  Not related to hypoxia or cardiac events as these have remained stable.  He got up this am and walked hall with nursing  Objective:   VITALS:   Vitals:   08/21/17 0000 08/21/17 0400  BP: 125/65 132/89  Pulse: 86 83  Resp: (!) 22 16  Temp: 98 F (36.7 C) 98.3 F (36.8 C)  SpO2: (!) 86% 94%   Confused to location, event Neurovascular intact Incision: dressing C/D/I  LABS Recent Labs    08/19/17 0820 08/20/17 0304 08/21/17 0311  HGB 15.4 13.4 11.7*  HCT 46.1 41.9 35.6*  WBC 13.6* 11.5* 10.6*  PLT 187 159 130*    Recent Labs    08/20/17 0304 08/21/17 0311  NA 137 136  K 4.5 4.4  BUN 32* 29*  CREATININE 1.49* 1.28*  GLUCOSE 146* 136*    No results for input(s): LABPT, INR in the last 72 hours.   Assessment/Plan: 2 Days Post-Op Procedure(s) (LRB): LEFT TOTAL  KNEE ARTHROPLASTY (Left)   Up with therapy  Transfer out of step down D/C norco and try tylenol and tramadol for pain to see if this helps clear brain D/C once medically stable

## 2017-08-21 NOTE — Progress Notes (Signed)
Physical Therapy Treatment Patient Details Name: Robert Sutton MRN: [id number] DOB: [birth year] Today's Date: 08/21/2017    History of Present Illness Pt s/p L TKR and with hx of R TKR (09) and DM.   Post OP confusion    PT Comments    POD # 2 pm session Assisted OOB to amb a greater distance in hallway then performed some TKR TE's followed by ICE.   Follow Up Recommendations  DC plan and follow up therapy as arranged by surgeon     Equipment Recommendations  None recommended by PT    Recommendations for Other Services       Precautions / Restrictions Precautions Precautions: Knee;Fall Restrictions Weight Bearing Restrictions: No LLE Weight Bearing: Weight bearing as tolerated    Mobility  Bed Mobility Overal bed mobility: Needs Assistance Bed Mobility: Supine to Sit     Supine to sit: Min assist Sit to supine: Max assist;+2 for physical assistance;+2 for safety/equipment   General bed mobility comments: increased self ability  Transfers Overall transfer level: Needs assistance Equipment used: Rolling walker (2 wheeled);None Transfers: Sit to/from [facility] Sit to Stand: Min assist;+2 physical assistance;+2 safety/equipment Stand pivot transfers: +2 physical assistance;+2 safety/equipment;Mod assist       General transfer comment: 50% VC's on safety with turns   Ambulation/Gait Ambulation/Gait assistance: Min assist;+2 physical assistance;+2 safety/equipment Ambulation Distance (Feet): 45 Feet Assistive device: Rolling walker (2 wheeled) Gait Pattern/deviations: Step-to pattern;Decreased step length - right;Decreased step length - left;Shuffle;Trunk flexed     General Gait Details: pt agreed to amb to window and back   Financial trader Rankin (Stroke Patients Only)       Balance                                            Cognition Arousal/Alertness:  Awake/alert Behavior During Therapy: WFL for tasks assessed/performed Overall Cognitive Status: No family/caregiver present to determine baseline cognitive functioning                                 General Comments: max encouragement      Exercises   Total Knee Replacement TE's 10 reps B LE ankle pumps 10 reps towel squeezes 10 reps knee presses 10 reps heel slides AAROM Followed by ICE     General Comments        Pertinent Vitals/Pain Pain Assessment: Faces Faces Pain Scale: Hurts even more Pain Location: L knee Pain Descriptors / Indicators: Discomfort;Grimacing;Operative site guarding Pain Intervention(s): Monitored during session;Repositioned;Ice applied    Home Living                      Prior Function            PT Goals (current goals can now be found in the care plan section) Progress towards PT goals: Progressing toward goals    Frequency    7X/week      PT Plan Current plan remains appropriate    Co-evaluation              AM-PAC PT "6 Clicks" Daily Activity  Outcome Measure  Difficulty turning over in bed (including adjusting bedclothes, sheets and blankets)?: Unable Difficulty moving from  lying on back to sitting on the side of the bed? : Unable Difficulty sitting down on and standing up from a chair with arms (e.g., wheelchair, bedside commode, etc,.)?: Unable Help needed moving to and from a bed to chair (including a wheelchair)?: A Lot Help needed walking in hospital room?: A Lot Help needed climbing 3-5 steps with a railing? : Total 6 Click Score: 8    End of Session Equipment Utilized During Treatment: Gait belt Activity Tolerance: Patient tolerated treatment well Patient left: in chair;with call bell/phone within reach Nurse Communication: Mobility status PT Visit Diagnosis: Pain;Difficulty in walking, not elsewhere classified (R26.2);Unsteadiness on feet (R26.81) Pain - Right/Left: Left Pain - part  of body: Knee     Time: 1330-1400 PT Time Calculation (min) (ACUTE ONLY): 30 min  Charges:  $Gait Training: 8-22 mins $Therapeutic Exercise: 8-22 mins                     G Codes:       Rica Koyanagi  PTA [facility]  Acute  Rehab Pager      [phone]

## 2017-08-21 NOTE — Progress Notes (Signed)
[facility] PROGRESS NOTE  Robert Sutton TDV:[id number] DOB: Nov 01, 1945 DOA: 08/19/2017 PCP: Raquel James, MD  Interim summary and HPI:  a 72 y.o. male with medical history significant of CHF he reports EF 20% sees cardiology at [facility] and reports he is suppose to get an AICD placed, DM, sleep apnea is s/p left TKR yesterday had an episode of trigiminy and hypoxia last night while sleeping.  He is up in bed drinking some coffee.  He is not very cooperative with questions.  He denies any sob.  Denies chest pain.  Has not had any fevers.  Oxygen sats are normal now while awake.  No cough.  Assessment/Plan: 1-S/P left TKA -post operative care as per orthopedic service   2-hypoxia/presumed sleep apnea: -most likely due to OSA/OHS in the setting of surgery and analgesics use. -patient educated about importance in the use of CPAP QHS; will also continue O2 supplementation during the day. -minimize the use of narcotics and benzo's -needs outpatient sleep study  3-hx of systolic HF -according to patient EF 20% -no CP and no crackles on exam -recommending daily weights and strict intake and output -no abnormalities concerning for ischemia on EKG -educated about heart healthy diet -continue home lasix dose -mild pulmonary vascular congestion on CXR; expecting improvement with use of diuretics and CPAP.   4-isolated trigeminy  -resolved with electrolytes repletion   5-Morbid obesity -Body mass index is 40.45 kg/m. -low calorie diet and exercise discussed  6-HTN -continue norvasc and lasix  7-type 2 diabetes -continue SSI and lantus -follow CBG's  Code Status: Full Family Communication: no family at bedside  Disposition Plan: will follow for another 24 hours to ensure stability; continue orders for [place] during the day and CPAP at night. Minimize use of narcotics and benzo's,   Procedures:  S/p left TKA  See below for x-ray reports   Antibiotics:  None    HPI/Subjective: Afebrile, no CP and denies SOB> patient reported moderate pain in his left knee. AAOX3.  Objective: Vitals:   08/21/17 1334 08/21/17 2050  BP: 126/71 132/72  Pulse: 90 92  Resp: 16 17  Temp: 98.4 F (36.9 C) 98.4 F (36.9 C)  SpO2: 92% 91%    Intake/Output Summary (Last 24 hours) at 08/21/2017 2217 Last data filed at 08/21/2017 2051 Gross per 24 hour  Intake 1360 ml  Output 3950 ml  Net -2590 ml   Filed Weights   08/19/17 0612  Weight: 131.5 kg (290 lb)    Exam:   General: AAOx3, in no distress and denies CP or SOB. Patient reported moderate pain in his left knee.  Cardiovascular: S1 and S2, no rubs, no gallops  Respiratory: no wheezing, good air movement, no frank crackles   Abdomen: soft, NT, ND, positive BS, obese  Musculoskeletal: left knee with clean/intact dressing, no cyanosis, no clubbing. Limited movement and ROM due to recent surgery.  Data Reviewed: Basic Metabolic Panel: Recent Labs  Lab 08/20/17 0304 08/21/17 0311  NA 137 136  K 4.5 4.4  CL 96* 95*  CO2 35* 34*  GLUCOSE 146* 136*  BUN 32* 29*  CREATININE 1.49* 1.28*  CALCIUM 8.5* 8.6*  MG 2.0  --    CBC: Recent Labs  Lab 08/19/17 0820 08/20/17 0304 08/21/17 0311  WBC 13.6* 11.5* 10.6*  NEUTROABS 9.3*  --   --   HGB 15.4 13.4 11.7*  HCT 46.1 41.9 35.6*  MCV 99.1 102.9* 99.2  PLT 187 159 130*   Cardiac Enzymes:  Recent Labs  Lab 08/20/17 1220 08/20/17 1445 08/20/17 2058  TROPONINI <0.03 <0.03 <0.03   BNP (last 3 results) Recent Labs    08/20/17 1220  BNP 343.6*   CBG: Recent Labs  Lab 08/20/17 2103 08/21/17 0731 08/21/17 1139 08/21/17 1659 08/21/17 2053  GLUCAP 247* 108* 78 67 185*    Recent Results (from the past 240 hour(s))  Surgical pcr screen     Status: Abnormal   Collection Time: 08/14/17  2:45 PM  Result Value Ref Range Status   MRSA, PCR NEGATIVE NEGATIVE Final   Staphylococcus aureus POSITIVE (A) NEGATIVE Final    Comment:  (NOTE) The Xpert SA Assay (FDA approved for NASAL specimens in patients 74 years of age and older), is one component of a comprehensive surveillance program. It is not intended to diagnose infection nor to guide or monitor treatment.      Studies: Dg Chest Port 1 View  Result Date: 08/20/2017 CLINICAL DATA:  Hypoxia. EXAM: PORTABLE CHEST 1 VIEW COMPARISON:  None. FINDINGS: Low lung volumes limit assessment. The heart is enlarged. Bronchovascular crowding with possible perihilar edema. Patchy bibasilar opacities. No large pleural effusion. No pneumothorax. IMPRESSION: Low lung volumes with bronchovascular crowding. Possible central pulmonary edema. Patchy bibasilar opacities may be atelectasis or aspiration. Electronically Signed   By: Jeb Levering M.D.   On: 08/20/2017 06:19    Scheduled Meds: . acetaminophen  1,000 mg Oral Q6H  . amLODipine  5 mg Oral BID  . apixaban  2.5 mg Oral Q12H  . docusate sodium  100 mg Oral BID  . ferrous sulfate  325 mg Oral TID PC  . fluticasone  1 spray Each Nare BID  . furosemide  120 mg Oral Daily   And  . furosemide  80 mg Oral QPM  . gabapentin  600 mg Oral TID  . glipiZIDE  10 mg Oral BID WC  . insulin aspart  0-15 Units Subcutaneous TID WC  . insulin glargine  30 Units Subcutaneous QHS  . mouth rinse  15 mL Mouth Rinse BID  . omeprazole  20 mg Oral BID  . polyethylene glycol  17 g Oral BID  . potassium chloride  40 mEq Oral Daily   And  . potassium chloride  20 mEq Oral QPM   Continuous Infusions: . sodium chloride 100 mL/hr at 08/20/17 1544  . methocarbamol (ROBAXIN)  IV      Principal Problem:   S/P left TKA Active Problems:   S/P total knee arthroplasty   Diabetes mellitus without complication (HCC)   Anxiety   CHF (congestive heart failure) ([place])   Sleep apnea   Dysrhythmia   Hypoxia    Time spent: 25 minutes    [facility] Pager [phone]. If 7PM-7AM, please contact night-coverage at  [email or web address], password [facility] 08/21/2017, 10:17 PM  LOS: 2 days

## 2017-08-22 LAB — CBC
HEMATOCRIT: 34.3 % — AB (ref 39.0–52.0)
Hemoglobin: 11.3 g/dL — ABNORMAL LOW (ref 13.0–17.0)
MCH: 32.7 pg (ref 26.0–34.0)
MCHC: 32.9 g/dL (ref 30.0–36.0)
MCV: 99.1 fL (ref 78.0–100.0)
PLATELETS: 131 10*3/uL — AB (ref 150–400)
RBC: 3.46 MIL/uL — AB (ref 4.22–5.81)
RDW: 14.7 % (ref 11.5–15.5)
WBC: 10.1 10*3/uL (ref 4.0–10.5)

## 2017-08-22 LAB — BASIC METABOLIC PANEL
ANION GAP: 8 (ref 5–15)
BUN: 21 mg/dL — ABNORMAL HIGH (ref 6–20)
CO2: 35 mmol/L — ABNORMAL HIGH (ref 22–32)
CREATININE: 1.07 mg/dL (ref 0.61–1.24)
Calcium: 8.8 mg/dL — ABNORMAL LOW (ref 8.9–10.3)
Chloride: 91 mmol/L — ABNORMAL LOW (ref 101–111)
GFR calc non Af Amer: >60 mL/min (ref >60)
Glucose, Bld: 185 mg/dL — ABNORMAL HIGH (ref 65–99)
POTASSIUM: 3.7 mmol/L (ref 3.5–5.1)
SODIUM: 134 mmol/L — AB (ref 135–145)

## 2017-08-22 LAB — GLUCOSE, CAPILLARY: Glucose-Capillary: 188 mg/dL — ABNORMAL HIGH (ref 65–99)

## 2017-08-22 MED ORDER — TRAMADOL HCL 50 MG PO TABS: 50.0000 mg | ORAL_TABLET | Freq: Four times a day (QID) | ORAL | 0 refills | Status: DC | PRN

## 2017-08-22 MED ORDER — METHOCARBAMOL 500 MG PO TABS: 500.0000 mg | ORAL_TABLET | Freq: Four times a day (QID) | ORAL | 0 refills | Status: DC | PRN

## 2017-08-22 NOTE — Discharge Summary (Signed)
Physician Discharge Summary  Patient ID: Robert Sutton MRN: [id number] DOB/AGE: 1946-05-21 72 y.o.  Admit date: 08/19/2017 Discharge date:  08/22/2017  Procedures:  Procedure(s) (LRB): LEFT TOTAL  KNEE ARTHROPLASTY (Left)  Attending Physician:  Dr. Paralee Cancel   Admission Diagnoses:    Left knee primary OA / pain  Discharge Diagnoses:  Principal Problem:   S/P left TKA Active Problems:   S/P total knee arthroplasty   Diabetes mellitus without complication ([place])   Anxiety   CHF (congestive heart failure) ([place])   Sleep apnea   Dysrhythmia   Hypoxia  Past Medical History:  Diagnosis Date  . Anxiety   . Cancer [facility]) 2009   Prostate  . CHF (congestive heart failure) ([place])   . Depression   . Diabetes mellitus without complication ([place])   . Dyspnea   . Dysrhythmia   . Headache   . Hypertension   . Sleep apnea     HPI:     Robert Sutton, 72 y.o. male, has a history of pain and functional disability in the left knee due to arthritis and has failed non-surgical conservative treatments for greater than 12 weeks to include NSAID's and/or analgesics, corticosteriod injections, use of assistive devices and activity modification.  Onset of symptoms was gradual, starting 7-9 years ago with gradually worsening course since that time. The patient noted no past surgery on the left knee(s).  Patient currently rates pain in the left knee(s) at 7 out of 10 with activity. Patient has worsening of pain with activity and weight bearing, pain that interferes with activities of daily living, pain with passive range of motion, crepitus and joint swelling.  Patient has evidence of periarticular osteophytes and joint space narrowing by imaging studies.  There is no active infection.  Risks, benefits and expectations were discussed with the patient.  Risks including but not limited to the risk of anesthesia, blood clots, nerve damage, blood vessel damage, failure of the prosthesis, infection and up to  and including death.  Patient understand the risks, benefits and expectations and wishes to proceed with surgery.   PCP: Raquel James, MD   Discharged Condition: good  Hospital Course:  Patient underwent the above stated procedure on 08/19/2017. Patient tolerated the procedure well and brought to the recovery room in good condition and subsequently to the floor.  POD #1 BP: 140/92 ; Pulse: 75 ; Temp: 98.1 F (36.7 C)  Patient reports pain as mild, pain controlled.  Trigeminy last night as well as SOB.  Patient is dependent on O2 and SOB is normal per patient. Dorsiflexion/plantar flexion intact, incision: dressing C/D/I, no cellulitis present and compartment soft.   LABS  Basename    HGB     13.4  HCT     41.9   POD #2  BP: 132/89 ; Pulse: 83 ; Temp: 98.3 F (36.8 C) ; Resp: 16 Patient confused this am.  Not related to hypoxia or cardiac events as these have remained stable.  He got up this am and walked hall with nursing. Neurovascular intact and incision: dressing C/D/I.   LABS  Basename    HGB     11.7  HCT     35.6   POD #3  BP: 118/65 ; Pulse: 93 ; Temp: 98.5 F (36.9 C) ; Resp: 15 Patient reports pain as mild, pain controlled. No events throughout the night. Feeling better this morning.  Stated that he worked well with PT yesterday.  Knows that he has  appointments with other doctors including his cardiologist at [facility].  I have discussed and encouraged him to work hard with PT. Looking forward to getting home.  Ready to be discharged home. Dorsiflexion/plantar flexion intact, incision: dressing C/D/I, no cellulitis present and compartment soft.   LABS  Basename    HGB     11.3  HCT     34.3     Discharge Exam: General appearance: alert, cooperative and no distress Extremities: Homans sign is negative, no sign of DVT, no edema, redness or tenderness in the calves or thighs and no ulcers, gangrene or trophic changes  Disposition: Home with follow up in 2  weeks   Follow-up Information    Paralee Cancel, MD. Schedule an appointment as soon as possible for a visit in 2 week(s).   Specialty:  Orthopedic Surgery Contact information: [address] [place] [address] [phone]           Discharge Instructions    Call MD / Call 911   Complete by:  As directed    If you experience chest pain or shortness of breath, CALL 911 and be transported to the hospital emergency room.  If you develope a fever above 101 F, pus (white drainage) or increased drainage or redness at the wound, or calf pain, call your surgeon's office.   Change dressing   Complete by:  As directed    Maintain surgical dressing until follow up in the clinic. If the edges start to pull up, may reinforce with tape. If the dressing is no longer working, may remove and cover with gauze and tape, but must keep the area dry and clean.  Call with any questions or concerns.   Constipation Prevention   Complete by:  As directed    Drink plenty of fluids.  Prune juice may be helpful.  You may use a stool softener, such as Colace (over the counter) 100 mg twice a day.  Use MiraLax (over the counter) for constipation as needed.   Diet - low sodium heart healthy   Complete by:  As directed    Discharge instructions   Complete by:  As directed    Maintain surgical dressing until follow up in the clinic. If the edges start to pull up, may reinforce with tape. If the dressing is no longer working, may remove and cover with gauze and tape, but must keep the area dry and clean.  Follow up in 2 weeks at [facility]. Call with any questions or concerns.   Increase activity slowly as tolerated   Complete by:  As directed    Weight bearing as tolerated with assist device (walker, cane, etc) as directed, use it as long as suggested by your surgeon or therapist, typically at least 4-6 weeks.   TED hose   Complete by:  As directed    Use stockings (TED hose) for 2  weeks on both leg(s).  You may remove them at night for sleeping.      Allergies as of 08/22/2017   No Known Allergies     Medication List    STOP taking these medications   acetaminophen 500 MG tablet Commonly known as:  TYLENOL     TAKE these medications   albuterol 108 (90 Base) MCG/ACT inhaler Commonly known as:  PROVENTIL HFA;VENTOLIN HFA Inhale 1 puff into the lungs every 6 (six) hours as needed for wheezing or shortness of breath.   alprazolam 2 MG tablet Commonly known as:  XANAX Take 2 mg by mouth 2 (two) times daily as needed for anxiety.   amLODipine 5 MG tablet Commonly known as:  NORVASC Take 5 mg by mouth 2 (two) times daily.   docusate sodium 100 MG capsule Commonly known as:  COLACE Take 1 capsule (100 mg total) by mouth 2 (two) times daily.   ELIQUIS 5 MG Tabs tablet Generic drug:  apixaban Take 5 mg by mouth 2 (two) times daily.   ferrous sulfate 325 (65 FE) MG tablet Commonly known as:  FERROUSUL Take 1 tablet (325 mg total) by mouth 3 (three) times daily with meals.   fluticasone 50 MCG/ACT nasal spray Commonly known as:  FLONASE Place 1 spray into both nostrils 2 (two) times daily.   furosemide 80 MG tablet Commonly known as:  LASIX Take 80-120 mg by mouth See admin instructions. Take 120 mg by mouth in the morning and take 80 mg by mouth in the early evening   gabapentin 300 MG capsule Commonly known as:  NEURONTIN Take 600 mg by mouth 3 (three) times daily.   glipiZIDE 10 MG tablet Commonly known as:  GLUCOTROL Take 10 mg by mouth 2 (two) times daily.   insulin glargine 100 unit/mL Sopn Commonly known as:  LANTUS Inject 30 Units into the skin at bedtime.   LIQUID TEARS OP Place 1-2 drops into both eyes daily as needed (for dry eyes).   lisinopril 40 MG tablet Commonly known as:  PRINIVIL,ZESTRIL Take 20 mg by mouth 2 (two) times daily.   magnesium oxide 400 MG tablet Commonly known as:  MAG-OX Take 400 mg by mouth 2 (two)  times daily.   methocarbamol 500 MG tablet Commonly known as:  ROBAXIN Take 1 tablet (500 mg total) by mouth every 6 (six) hours as needed for muscle spasms.   omeprazole 20 MG capsule Commonly known as:  PRILOSEC Take 20 mg by mouth 2 (two) times daily.   polyethylene glycol packet Commonly known as:  MIRALAX / GLYCOLAX Take 17 g by mouth 2 (two) times daily.   potassium chloride SA 20 MEQ tablet Commonly known as:  K-DUR,KLOR-CON Take 20-40 mEq by mouth See admin instructions. Take 40 mg by mouth in the morning and take 20 mg by mouth in the early evening   traMADol 50 MG tablet Commonly known as:  ULTRAM Take 1-2 tablets (50-100 mg total) by mouth every 6 (six) hours as needed for moderate pain.   vitamin B-12 1000 MCG tablet Commonly known as:  CYANOCOBALAMIN Take 1,000 mcg by mouth daily.            Discharge Care Instructions  (From admission, onward)        Start     Ordered   08/22/17 0000  Change dressing    Comments:  Maintain surgical dressing until follow up in the clinic. If the edges start to pull up, may reinforce with tape. If the dressing is no longer working, may remove and cover with gauze and tape, but must keep the area dry and clean.  Call with any questions or concerns.   08/22/17 0757       Signed: [place]. Kennya Schwenn   PA-C  08/22/2017, 8:01 AM

## 2017-08-22 NOTE — Evaluation (Signed)
Occupational Therapy Evaluation Patient Details Name: Robert Sutton MRN: [id number] DOB: 1946-04-08 Today's Date: 08/22/2017    History of Present Illness Pt s/p L TKR and with hx of R TKR (09) and DM.   Post OP confusion   Clinical Impression   This 71 year old man was admitted for the above.  Will see him once more when wife is here to further educate on safe shower transfer and make sure they are agreeable to DME    Follow Up Recommendations  Supervision/Assistance - 24 hour    Equipment Recommendations  3 in 1 bedside commode(likely)    Recommendations for Other Services       Precautions / Restrictions Precautions Precautions: Knee;Fall Required Braces or Orthoses: Knee Immobilizer - Left Knee Immobilizer - Left: Discontinue once straight leg raise with < 10 degree lag Restrictions Weight Bearing Restrictions: No LLE Weight Bearing: Weight bearing as tolerated      Mobility Bed Mobility         Supine to sit: Min assist     General bed mobility comments: HOB raised  Transfers   Equipment used: Rolling walker (2 wheeled);None   Sit to Stand: Min assist         General transfer comment: multimodal cues for UE/LE placement; steadying assistance    Balance                                           ADL either performed or assessed with clinical judgement   ADL Overall ADL's : Needs assistance/impaired             Lower Body Bathing: Moderate assistance;Sit to/from stand       Lower Body Dressing: Maximal assistance;Sit to/from stand   Toilet Transfer: Minimal assistance;Ambulation;BSC;RW             General ADL Comments: pt can perform UB adls with set up.  He stood to use urinal with min A     Vision         Perception     Praxis      Pertinent Vitals/Pain Pain Assessment: Faces Faces Pain Scale: Hurts little more Pain Location: L knee Pain Descriptors / Indicators: Discomfort Pain Intervention(s):  Limited activity within patient's tolerance;Monitored during session;Premedicated before session;Repositioned;Ice applied     Hand Dominance     Extremity/Trunk Assessment Upper Extremity Assessment Upper Extremity Assessment: Overall WFL for tasks assessed           Communication Communication Communication: HOH   Cognition Arousal/Alertness: Awake/alert Behavior During Therapy: WFL for tasks assessed/performed                                   General Comments: difficulty processing; multimodal cues given, ? hearing   General Comments       Exercises     Shoulder Instructions      Home Living Family/patient expects to be discharged to:: Private residence Living Arrangements: Spouse/significant other Available Help at Discharge: Family               Bathroom Shower/Tub: Walk-in Corporate treasurer Toilet: Handicapped height         Additional Comments: nothing to push up from; window next to commode      Prior Functioning/Environment Level of Independence: Independent  OT Problem List: Decreased strength;Decreased activity tolerance;Pain;Decreased knowledge of use of DME or AE;Decreased safety awareness(cognition vs HOH)      OT Treatment/Interventions: Self-care/ADL training;DME and/or AE instruction;Patient/family education;Balance training;Therapeutic activities    OT Goals(Current goals can be found in the care plan section) Acute Rehab OT Goals Patient Stated Goal: Regain IND OT Goal Formulation: With patient Time For Goal Achievement: 09/05/17 Potential to Achieve Goals: Good ADL Goals Pt Will Perform Tub/Shower Transfer: Shower transfer;with min guard assist;3 in 1  OT Frequency: Min 2X/week   Barriers to D/C:            Co-evaluation              AM-PAC PT "6 Clicks" Daily Activity     Outcome Measure Help from another person eating meals?: None Help from another person taking care of  personal grooming?: A Little Help from another person toileting, which includes using toliet, bedpan, or urinal?: A Little Help from another person bathing (including washing, rinsing, drying)?: A Lot Help from another person to put on and taking off regular upper body clothing?: A Little Help from another person to put on and taking off regular lower body clothing?: A Lot 6 Click Score: 17   End of Session    Activity Tolerance: Patient tolerated treatment well Patient left: in chair;with call bell/phone within reach;with chair alarm set  OT Visit Diagnosis: Pain Pain - Right/Left: Left Pain - part of body: Knee                Time: 0828-0903 OT Time Calculation (min): 35 min Charges:  OT General Charges $OT Visit: 1 Visit OT Evaluation $OT Eval Low Complexity: 1 Low OT Treatments $Self Care/Home Management : 8-22 mins G-Codes:     [place], OTR/L [phone] 08/22/2017  [name] 08/22/2017, 10:29 AM

## 2017-08-22 NOTE — Progress Notes (Signed)
     Subjective: 3 Days Post-Op Procedure(s) (LRB): LEFT TOTAL  KNEE ARTHROPLASTY (Left)   Patient reports pain as mild, pain controlled. No events throughout the night. Feeling better this morning.  Stated that he worked well with PT yesterday.  Knows that he has appointments with other doctors including his cardiologist at [facility].  I have discussed and encouraged him to work hard with PT. Looking forward to getting home.  Ready to be discharged home.  Objective:   VITALS:   Vitals:   08/21/17 2050 08/22/17 0509  BP: 132/72 118/65  Pulse: 92 93  Resp: 17 15  Temp: 98.4 F (36.9 C) 98.5 F (36.9 C)  SpO2: 91% 96%    Dorsiflexion/Plantar flexion intact Incision: dressing C/D/I No cellulitis present Compartment soft  LABS Recent Labs    08/20/17 0304 08/21/17 0311 08/22/17 0525  HGB 13.4 11.7* 11.3*  HCT 41.9 35.6* 34.3*  WBC 11.5* 10.6* 10.1  PLT 159 130* 131*    Recent Labs    08/20/17 0304 08/21/17 0311 08/22/17 0525  NA 137 136 134*  K 4.5 4.4 3.7  BUN 32* 29* 21*  CREATININE 1.49* 1.28* 1.07  GLUCOSE 146* 136* 185*     Assessment/Plan: 3 Days Post-Op Procedure(s) (LRB): LEFT TOTAL  KNEE ARTHROPLASTY (Left)   Up with therapy Discharge home OPPT is already set up Follow [facility] in 2 weeks at [facility]. Follow up with OLIN,Mitchelle Sultan D in 2 weeks.  Contact information:  [facility] [address] [place]   PAC  08/22/2017, 7:51 AM

## 2017-08-22 NOTE — Progress Notes (Signed)
   08/22/17 1200  OT Visit Information  Last OT Received On 08/22/17  Assistance Needed +1  History of Present Illness Pt s/p L TKR and with hx of R TKR (09) and DM.   Post OP confusion  Precautions  Precautions Knee;Fall  Required Braces or Orthoses Knee Immobilizer - Left  Knee Immobilizer - Left Discontinue once straight leg raise with < 10 degree lag  Pain Assessment  Faces Pain Scale 4  Pain Location L knee  Pain Descriptors / Indicators Discomfort  Pain Intervention(s) Limited activity within patient's tolerance;Monitored during session;Premedicated before session;Repositioned  Cognition  Arousal/Alertness Awake/alert  Behavior During Therapy [facility] for tasks assessed/performed  General Comments improved processing and safety; cues still needed  ADL  Tub/ Fish farm manager guard;Walk-in shower;Ambulation;3 in 1  General ADL Comments wife present. Gave shower sequence handout also.  Restrictions  LLE Weight Bearing WBAT  Transfers  Equipment used Rolling walker (2 wheeled);None  Sit to Stand Min guard  General transfer comment cues for UE/LE placement  OT - End of Session  Activity Tolerance Patient tolerated treatment well  Patient left in chair;with call bell/phone within reach;with chair alarm set  OT Assessment/Plan  OT Visit Diagnosis Pain  Pain - Right/Left Left  Pain - part of body Knee  Follow Up Recommendations Supervision/Assistance - 24 hour  OT Equipment 3 in 1 bedside commode  AM-PAC OT "6 Clicks" Daily Activity Outcome Measure  Help from another person eating meals? 4  Help from another person taking care of personal grooming? 3  Help from another person toileting, which includes using toliet, bedpan, or urinal? 3  Help from another person bathing (including washing, rinsing, drying)? 2  Help from another person to put on and taking off regular upper body clothing? 3  Help from another person to put on and taking off regular lower body clothing? 2  6 Click  Score 17  ADL G Code Conversion CK  OT Goal Progression  Progress towards OT goals Goals met/education completed, patient discharged from OT  OT Time Calculation  OT Start Time (ACUTE ONLY) 1129  OT Stop Time (ACUTE ONLY) 1137  OT Time Calculation (min) 8 min  OT General Charges  $OT Visit 1 Visit  OT Treatments  $Self Care/Home Management  8-22 mins  Lesle Chris, OTR/L [phone] 08/22/2017

## 2017-08-22 NOTE — Progress Notes (Signed)
Physical Therapy Treatment Patient Details Name: Robert Sutton MRN: [id number] DOB: 1946-02-03 Today's Date: 08/22/2017    History of Present Illness Pt s/p L TKR and with hx of R TKR (09) and DM.   Post OP confusion    PT Comments    POD # 3 Pt more alert and oriented today.  Does require some repeat VC's but I think that's because he is selective HOH. Tolerated an increased amb distance and performed TKR TE's.  Also practiced 2 steps B rails.  Pt will need another PT session to practice 4 steps with spouse.    Follow Up Recommendations  DC plan and follow up therapy as arranged by surgeon     Equipment Recommendations  None recommended by PT    Recommendations for Other Services       Precautions / Restrictions Precautions Precautions: Knee;Fall Precaution Comments: instructed to weqar KI for stairs Required Braces or Orthoses: Knee Immobilizer - Left Knee Immobilizer - Left: Discontinue once straight leg raise with < 10 degree lag Restrictions Weight Bearing Restrictions: No LLE Weight Bearing: Weight bearing as tolerated    Mobility  Bed Mobility         Supine to sit: Min assist     General bed mobility comments: OOB in recliner  Transfers Overall transfer level: Needs assistance Equipment used: Rolling walker (2 wheeled);None Transfers: Sit to/from [facility] Sit to Stand: Supervision         General transfer comment: one VC safety with turns  Ambulation/Gait Ambulation/Gait assistance: Supervision;Min guard Ambulation Distance (Feet): 85 Feet Assistive device: Rolling walker (2 wheeled) Gait Pattern/deviations: Step-to pattern;Decreased step length - right;Decreased step length - left;Shuffle;Trunk flexed     General Gait Details: tolerated an increased distance   Stairs    2 steps B rails          Wheelchair Mobility    Modified Rankin (Stroke Patients Only)       Balance                                            Cognition Arousal/Alertness: Awake/alert Behavior During Therapy: WFL for tasks assessed/performed Overall Cognitive Status: Within Functional Limits for tasks assessed                                 General Comments: much improved cognition      Exercises   Total Knee Replacement TE's 10 reps B LE ankle pumps 10 reps towel squeezes 10 reps knee presses 10 reps heel slides  10 reps SAQ's 10 reps SLR's 10 reps ABD Followed by ICE     General Comments        Pertinent Vitals/Pain Pain Assessment: 0-10 Faces Pain Scale: Hurts little more Pain Location: L knee Pain Descriptors / Indicators: Sore;Tender;Operative site guarding Pain Intervention(s): Monitored during session;Repositioned;Premedicated before session;Ice applied    Home Living Family/patient expects to be discharged to:: Private residence Living Arrangements: Spouse/significant other Available Help at Discharge: Family           Additional Comments: nothing to push up from; window next to commode    Prior Function Level of Independence: Independent          PT Goals (current goals can now be found in the care plan section) Acute Rehab PT  Goals Patient Stated Goal: Regain IND Progress towards PT goals: Progressing toward goals    Frequency    7X/week      PT Plan Current plan remains appropriate    Co-evaluation              AM-PAC PT "6 Clicks" Daily Activity  Outcome Measure  Difficulty turning over in bed (including adjusting bedclothes, sheets and blankets)?: Unable Difficulty moving from lying on back to sitting on the side of the bed? : Unable Difficulty sitting down on and standing up from a chair with arms (e.g., wheelchair, bedside commode, etc,.)?: Unable Help needed moving to and from a bed to chair (including a wheelchair)?: A Lot Help needed walking in hospital room?: A Lot Help needed climbing 3-5 steps with a railing? : Total 6  Click Score: 8    End of Session Equipment Utilized During Treatment: Gait belt Activity Tolerance: Patient tolerated treatment well Patient left: in chair;with call bell/phone within reach Nurse Communication: Mobility status PT Visit Diagnosis: Pain;Difficulty in walking, not elsewhere classified (R26.2);Unsteadiness on feet (R26.81) Pain - Right/Left: Left Pain - part of body: Knee     Time: 0930-1010 PT Time Calculation (min) (ACUTE ONLY): 40 min  Charges:  $Gait Training: 8-22 mins $Therapeutic Exercise: 8-22 mins $Therapeutic Activity: 8-22 mins                    G Codes:       Rica Koyanagi  PTA [facility]  Acute  Rehab Pager      [phone]

## 2017-08-22 NOTE — Progress Notes (Signed)
Patient discharged to home w/ family. Given all belongings, instructions, prescriptions. Verbalized understanding of all instructions. Escorted to pov via w/c. 

## 2017-08-22 NOTE — Progress Notes (Signed)
Physical Therapy Treatment Patient Details Name: Robert Sutton MRN: [id number] DOB: February 05, 1946 Today's Date: 08/22/2017    History of Present Illness Pt s/p L TKR and with hx of R TKR (09) and DM.   Post OP confusion    PT Comments    POD # 3 second session to practice stairs with spouse.  Follow Up Recommendations  DC plan and follow up therapy as arranged by surgeon     Equipment Recommendations  None recommended by PT    Recommendations for Other Services       Precautions / Restrictions Precautions Precautions: Knee;Fall Precaution Comments: instructed to weqar KI for stairs Required Braces or Orthoses: Knee Immobilizer - Left Knee Immobilizer - Left: Discontinue once straight leg raise with < 10 degree lag Restrictions Weight Bearing Restrictions: No LLE Weight Bearing: Weight bearing as tolerated    Mobility  Bed Mobility               General bed mobility comments: OOB in recliner  Transfers Overall transfer level: Needs assistance Equipment used: Rolling walker (2 wheeled);None Transfers: Sit to/from [facility] Sit to Stand: Supervision         General transfer comment: one VC safety with turns  Ambulation/Gait Ambulation/Gait assistance: Supervision;Min guard Ambulation Distance (Feet): 22 Feet Assistive device: Rolling walker (2 wheeled) Gait Pattern/deviations: Step-to pattern;Decreased step length - right;Decreased step length - left;Shuffle;Trunk flexed Gait velocity: decr   General Gait Details: session focused on stairs thus decreased amb distance   Stairs Stairs: Yes     Number of Stairs: 4 General stair comments: with spouse "hands on" practiced 4 steps using one rail and one crutch.  Also instructed to wear KI for increased support/stability.  Wheelchair Mobility    Modified Rankin (Stroke Patients Only)       Balance                                            Cognition  Arousal/Alertness: Awake/alert Behavior During Therapy: WFL for tasks assessed/performed Overall Cognitive Status: Within Functional Limits for tasks assessed                                 General Comments: much improved cognition      Exercises      General Comments        Pertinent Vitals/Pain Pain Assessment: 0-10 Faces Pain Scale: Hurts little more Pain Location: L knee Pain Descriptors / Indicators: Sore;Tender;Operative site guarding Pain Intervention(s): Monitored during session;Repositioned;Premedicated before session;Ice applied    Home Living                      Prior Function            PT Goals (current goals can now be found in the care plan section) Progress towards PT goals: Progressing toward goals    Frequency    7X/week      PT Plan Current plan remains appropriate    Co-evaluation              AM-PAC PT "6 Clicks" Daily Activity  Outcome Measure  Difficulty turning over in bed (including adjusting bedclothes, sheets and blankets)?: Unable Difficulty moving from lying on back to sitting on the side of the bed? : Unable Difficulty sitting  down on and standing up from a chair with arms (e.g., wheelchair, bedside commode, etc,.)?: Unable Help needed moving to and from a bed to chair (including a wheelchair)?: A Lot Help needed walking in hospital room?: A Lot Help needed climbing 3-5 steps with a railing? : Total 6 Click Score: 8    End of Session Equipment Utilized During Treatment: Gait belt Activity Tolerance: Patient tolerated treatment well Patient left: in chair;with call bell/phone within reach Nurse Communication: (pt ready for D/C to home) PT Visit Diagnosis: Pain;Difficulty in walking, not elsewhere classified (R26.2);Unsteadiness on feet (R26.81) Pain - Right/Left: Left Pain - part of body: Knee     Time: 1100-1125 PT Time Calculation (min) (ACUTE ONLY): 25 min  Charges:  $Gait Training: 8-22  mins $Therapeutic Activity: 8-22 mins                    G Codes:       Rica Koyanagi  PTA [facility]  Acute  Rehab Pager      [phone]

## 2019-02-14 IMAGING — DX DG CHEST 1V PORT
1 series · 1 of 1 positions shown · non-contrast
Comparison: None.

CLINICAL DATA: Hypoxia.

EXAM:
PORTABLE CHEST 1 VIEW

[chest ap]
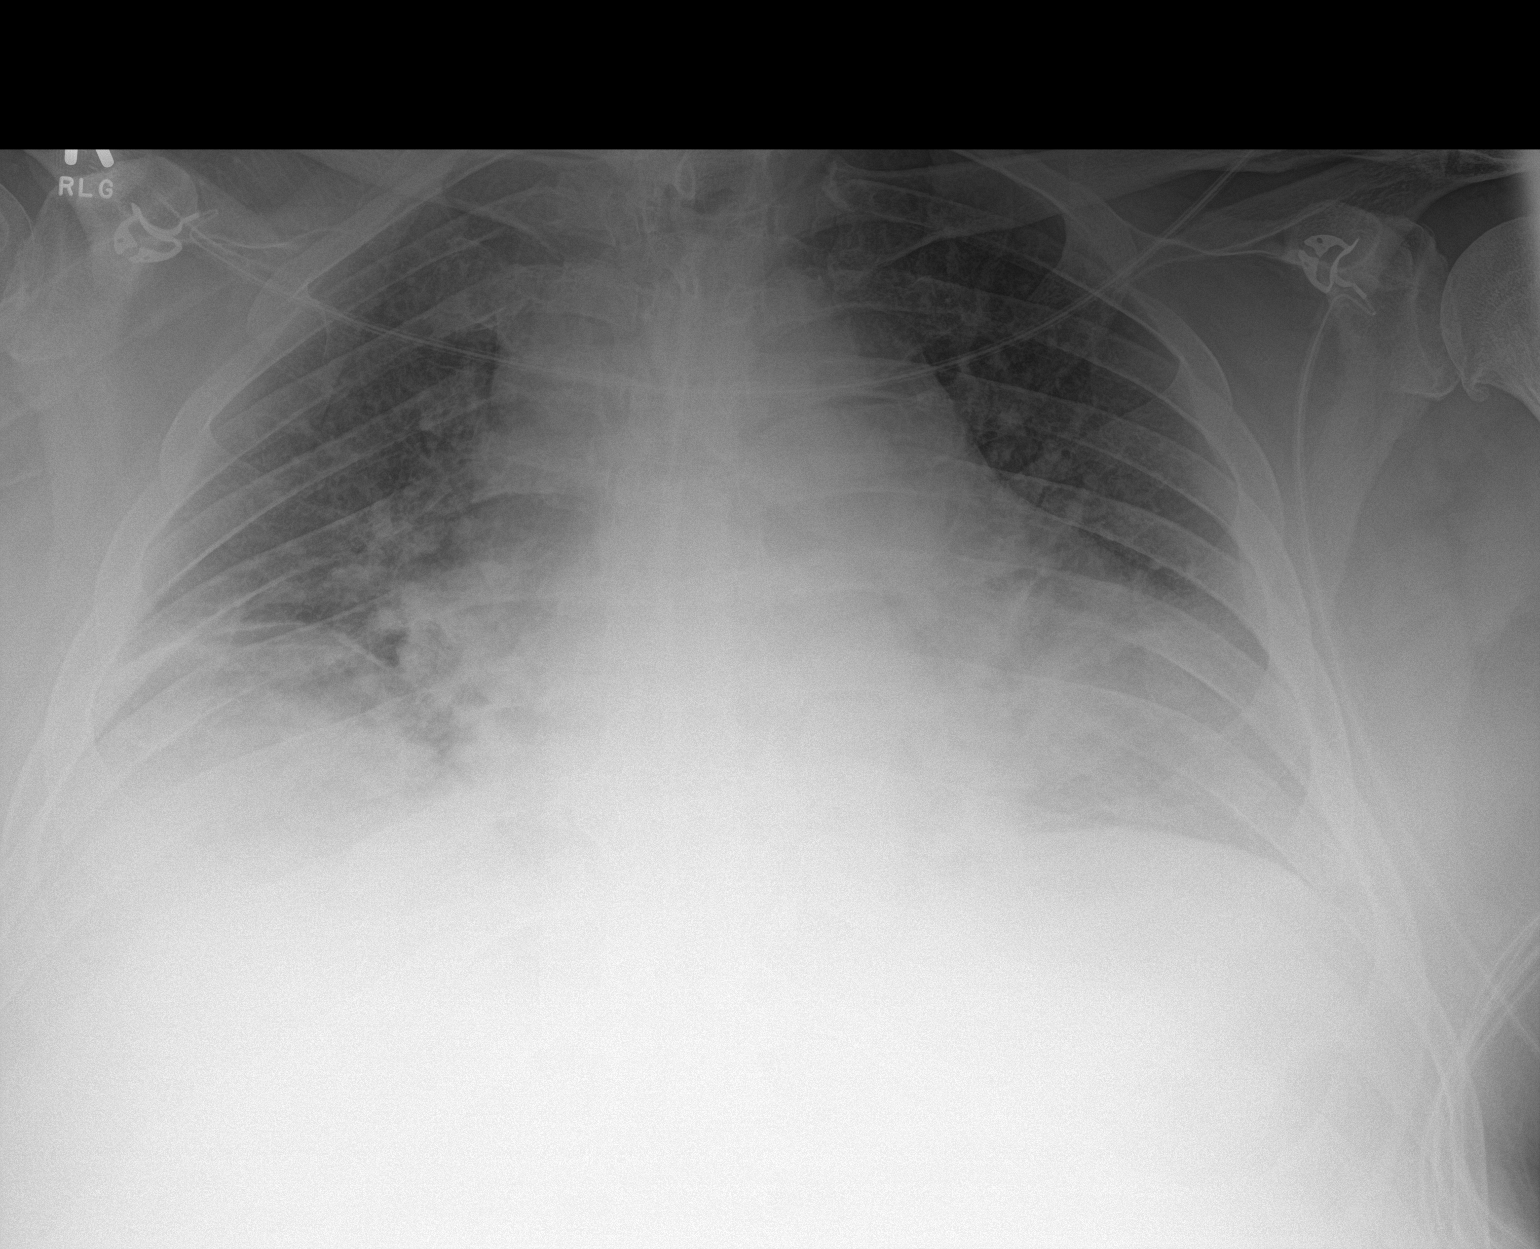

[1 of 1 positions shown; findings below may reference images not displayed]

FINDINGS: Low lung volumes limit assessment. The heart is enlarged.
Bronchovascular crowding with possible perihilar edema. Patchy
bibasilar opacities. No large pleural effusion. No pneumothorax.
IMPRESSION: Low lung volumes with bronchovascular crowding. Possible central
pulmonary edema.

Patchy bibasilar opacities may be atelectasis or aspiration.

## 2020-01-26 ENCOUNTER — Encounter: Payer: Self-pay | Admitting: Gastroenterology

## 2020-03-14 ENCOUNTER — Ambulatory Visit (INDEPENDENT_AMBULATORY_CARE_PROVIDER_SITE_OTHER): Payer: No Typology Code available for payment source | Admitting: Gastroenterology

## 2020-03-14 ENCOUNTER — Encounter: Payer: Self-pay | Admitting: Gastroenterology

## 2020-03-14 ENCOUNTER — Telehealth: Payer: Self-pay | Admitting: Gastroenterology

## 2020-03-14 VITALS — BP 140/80 | HR 70 | Ht 71.0 in | Wt 296.8 lb

## 2020-03-14 DIAGNOSIS — K529 Noninfective gastroenteritis and colitis, unspecified: Secondary | ICD-10-CM

## 2020-03-14 DIAGNOSIS — Z86010 Personal history of colonic polyps: Secondary | ICD-10-CM

## 2020-03-14 DIAGNOSIS — Z7901 Long term (current) use of anticoagulants: Secondary | ICD-10-CM | POA: Diagnosis not present

## 2020-03-14 NOTE — Progress Notes (Addendum)
Robert Sutton Gastroenterology Consult Note:  History: Robert Sutton 03/14/2020  Referring provider: Raquel James, MD  Reason for consult/chief complaint: Colonoscopy (Discuss having a colonoscopy due to him being on a blood thinner)   Subjective  HPI:  This is a pleasant 74 year old man referred by his clinic at the local [facility].  [place] clinic note indicating early 6 months of diarrhea, and it may have started around the time Metformin was begun and also with a tapering of benzodiazepine.  [facility] with sigmoid diverticulosis and anal fissure. That report is also included with these records.  Robert Sutton says he still has intermittent diarrhea, but thinks it is probably just something he eats and he does not consider it to be a problem.  I noted that he is no longer on Metformin, but he did not seem to recognize that medicine and did not know if it been stopped. He denies abdominal pain, dysphagia or vomiting.  Unfortunately, there are no cardiology records from the [place].  I noted he has a pacemaker and/or AICD and he is also on Eliquis.  With direct questioning, he was able to tell me that he has a history of atrial fibrillation and some kind of procedure done for that (?  Ablation?  Maze).  He also recalls at some point his ejection fraction was about 25%, and then on a subsequent test was about 45%.  He thinks he saw his cardiologist at the [facility] within the last several months and also had an echocardiogram. He wondered why I was asking all these questions since he recalls having his 2010 colonoscopy with no problems" I was awake the whole time" (report indicates that he received 175 mcg of fentanyl and 9 mg of Versed for the procedure)  ROS:  Review of Systems  Constitutional: Positive for fatigue. Negative for appetite change and unexpected weight change.  HENT: Negative for mouth sores and voice change.   Eyes: Negative for pain and redness.    Respiratory: Positive for shortness of breath. Negative for cough.   Cardiovascular: Positive for leg swelling. Negative for chest pain and palpitations.  Genitourinary: Negative for dysuria and hematuria.  Musculoskeletal: Positive for arthralgias and back pain. Negative for myalgias.  Skin: Negative for pallor and rash.  Neurological: Positive for headaches. Negative for weakness.  Hematological: Negative for adenopathy.  Psychiatric/Behavioral:       Anxiety     Past Medical History: Past Medical History:  Diagnosis Date  . Anal fissure   . Anxiety   . Atrial fibrillation (Robert Sutton)   . Cancer [facility]) 2009   Prostate  . CHF (congestive heart failure) ([place])   . Depression   . Diabetes mellitus without complication ([place])   . Diverticulosis   . Dyspnea   . Dysrhythmia   . Headache   . Hypertension   . Sleep apnea      Past Surgical History: Past Surgical History:  Procedure Laterality Date  . CARDIAC ELECTROPHYSIOLOGY MAPPING AND ABLATION    . COLONOSCOPY    . HIP SURGERY    . JOINT REPLACEMENT  2009   R Knee replacement  . TONSILLECTOMY    . TOTAL KNEE ARTHROPLASTY Left 08/19/2017   Procedure: LEFT TOTAL  KNEE ARTHROPLASTY;  Surgeon: Paralee Cancel, MD;  Location: [facility];  Service: Orthopedics;  Laterality: Left;  70 mins     Family History: Family History  Problem Relation Age of Onset  . Colon cancer Neg  Hx   . Stomach cancer Neg Hx   . Esophageal cancer Neg Hx   . Pancreatic cancer Neg Hx     Social History: Social History   Socioeconomic History  . Marital status: Married    Spouse name: Not on file  . Number of children: Not on file  . Years of education: Not on file  . Highest education level: Not on file  Occupational History  . Not on file  Tobacco Use  . Smoking status: Former Research scientist (life sciences)  . Smokeless tobacco: Never Used  . Tobacco comment: Quit 40 years ago  Vaping Use  . Vaping Use: Never used  Substance and Sexual Activity  . Alcohol use: Yes     Comment: Cocktail-Daily  . Drug use: No  . Sexual activity: Not on file  Other Topics Concern  . Not on file  Social History Narrative  . Not on file   Social Determinants of Health   Financial Resource Strain:   . Difficulty of Paying Living Expenses:   Food Insecurity:   . Worried About Charity fundraiser in the Last Year:   . Arboriculturist in the Last Year:   Transportation Needs:   . Film/video editor (Medical):   Marland Kitchen Lack of Transportation (Non-Medical):   Physical Activity:   . Days of Exercise per Week:   . Minutes of Exercise per Session:   Stress:   . Feeling of Stress :   Social Connections:   . Frequency of Communication with Friends and Family:   . Frequency of Social Gatherings with Friends and Family:   . Attends Religious Services:   . Active Member of Clubs or Organizations:   . Attends Archivist Meetings:   Marland Kitchen Marital Status:     Allergies: No Known Allergies  Outpatient Meds: Current Outpatient Medications  Medication Sig Dispense Refill  . albuterol (PROVENTIL HFA;VENTOLIN HFA) 108 (90 Base) MCG/ACT inhaler Inhale 1 puff into the lungs every 6 (six) hours as needed for wheezing or shortness of breath.    . alprazolam (XANAX) 2 MG tablet Take 2 mg by mouth 2 (two) times daily as needed for anxiety.    Marland Kitchen amLODipine (NORVASC) 5 MG tablet Take 5 mg by mouth 2 (two) times daily.     Marland Kitchen apixaban (ELIQUIS) 5 MG TABS tablet Take 5 mg by mouth 2 (two) times daily.    Marland Kitchen docusate sodium (COLACE) 100 MG capsule Take 1 capsule (100 mg total) by mouth 2 (two) times daily. 10 capsule 0  . ferrous sulfate (FERROUSUL) 325 (65 FE) MG tablet Take 1 tablet (325 mg total) by mouth 3 (three) times daily with meals.  3  . fluticasone (FLONASE) 50 MCG/ACT nasal spray Place 1 spray into both nostrils 2 (two) times daily.    . furosemide (LASIX) 80 MG tablet Take 80-120 mg by mouth See admin instructions. Take 120 mg by mouth in the morning and take 80 mg by  mouth in the early evening    . gabapentin (NEURONTIN) 300 MG capsule Take 600 mg by mouth 3 (three) times daily.    Marland Kitchen glipiZIDE (GLUCOTROL) 10 MG tablet Take 10 mg by mouth 2 (two) times daily.    . insulin glargine (LANTUS) 100 unit/mL SOPN Inject 30 Units into the skin at bedtime.    Marland Kitchen lisinopril (PRINIVIL,ZESTRIL) 40 MG tablet Take 20 mg by mouth 2 (two) times daily.    . magnesium oxide (MAG-OX) 400 MG tablet  Take 400 mg by mouth 2 (two) times daily.    . methocarbamol (ROBAXIN) 500 MG tablet Take 1 tablet (500 mg total) by mouth every 6 (six) hours as needed for muscle spasms. 40 tablet 0  . omeprazole (PRILOSEC) 20 MG capsule Take 20 mg by mouth 2 (two) times daily.    . polyethylene glycol (MIRALAX / GLYCOLAX) packet Take 17 g by mouth 2 (two) times daily. 14 each 0  . Polyvinyl Alcohol (LIQUID TEARS OP) Place 1-2 drops into both eyes daily as needed (for dry eyes).    . potassium chloride SA (K-DUR,KLOR-CON) 20 MEQ tablet Take 20-40 mEq by mouth See admin instructions. Take 40 mg by mouth in the morning and take 20 mg by mouth in the early evening    . traMADol (ULTRAM) 50 MG tablet Take 1-2 tablets (50-100 mg total) by mouth every 6 (six) hours as needed for moderate pain. 40 tablet 0  . vitamin B-12 (CYANOCOBALAMIN) 1000 MCG tablet Take 1,000 mcg by mouth daily.     No current facility-administered medications for this visit.      ___________________________________________________________________ Objective   Exam:  BP 140/80 (BP Location: Left Arm, Patient Position: Sitting)   Pulse 70   Ht 5\' 11"  (1.803 m)   Wt 296 lb 12.8 oz (134.6 kg)   SpO2 90%   BMI 41.40 kg/m    General: Ambulatory, conversational, not acutely ill-appearing.  Eyes: sclera anicteric, no redness  ENT: oral mucosa moist without lesions, no cervical or supraclavicular lymphadenopathy  CV: RRR without appreciable murmur, S1/S2, no JVD(difficult to evaluate due to body habitus), + peripheral edema.   Pacemaker or AICD left upper chest wall   resp: clear to auscultation bilaterally, normal RR and effort noted  GI: soft, no tenderness, with active bowel sounds.  Cannot assess mass or hepatosplenomegaly due to morbid obesity  Skin; warm and dry, no rash or jaundice noted  Neuro: awake, alert and oriented x 3. Normal gross motor function and fluent speech  Labs:  01/17/2020 labs: AST/ALT/total bilirubin normal Creatinine 1.1 Hematocrit 44  Assessment: Encounter Diagnoses  Name Primary?  . Chronic diarrhea Yes  . Personal history of colonic polyps   . Current use of long term anticoagulation     This patient was referred for chronic diarrhea that I am having a difficult time characterizing.  He really does not consider it to be a problem.  He feels that he is just here to schedule a routine screening colonoscopy. I explained to him that I need more information from his cardiologist regarding the current status of his atrial fibrillation, most recent echocardiogram report for valvular function and LV ejection fraction, and also risk assessment from the cardiologist to have this patient off oral anticoagulation 2 days prior to a colonoscopy. I did my best to explain the importance of that and the safety implications of having that information prior to scheduling a colonoscopy, and he was agreeable to that but ultimately concluded that "doc, I think you worry too much".  Plan:  Robert Sutton is been able to provide the name of his cardiologist at the [place].  I hope we will be able to communicate with them and get written correspondence as requested above.  When that happens, we can then schedule his screening colonoscopy in the appropriate location and with necessary preparations.  Presumably he did not have it at the [place] because of his elevated BMI.  Thank you for the courtesy of this consult.  Please  call me with any questions or concerns. (Total time 45 minutes with extensive [facility] and  conversation with patient required) Nelida Meuse III  CC: Referring provider noted above  Record review addendum   05/12/2020:  We received a letter from this patient's provider in the [facility] cardiology clinic dated 05/10/2020 Mitzi Hansen [place], [place]) It includes a cover letter and note from the most recent visit on 01/11/2020  The cover letter states that at the last visit on June 1, the patient was doing well and was symptomatically stable.  His most recent echocardiogram from 04/27/2019 shows EF 40 to 45%.  Patient has an AICD for primary prevention of sudden cardiac death due to arrhythmia.  He has had previous A. fib ablation and remains in a paced rhythm on his most recent EKG.  This provider stated the patient could hold his Eliquis for 3 days prior to the procedure without bridging medication, and to proceed with colonoscopy without further preprocedure cardiac testing.  We will contact the patient to make arrangements for colonoscopy with me.  Madelon Lips, MD

## 2020-03-14 NOTE — Patient Instructions (Signed)
If you are age 74 or older, your body mass index should be between 23-30. Your Body mass index is 41.4 kg/m. If this is out of the aforementioned range listed, please consider follow up with your Primary Care Provider.  If you are age 74 or younger, your body mass index should be between 19-25. Your Body mass index is 41.4 kg/m. If this is out of the aformentioned range listed, please consider follow up with your Primary Care Provider.   Please call the office with your Cardiologist information.   It was a pleasure to see you today!  Dr. Loletha Carrow

## 2020-03-14 NOTE — Telephone Encounter (Signed)
Pt calling and requesting to speak with Vivien Rota, states he is reading his AVS and there are several things on there that he thinks need to be corrected.

## 2020-03-15 NOTE — Telephone Encounter (Signed)
I attempted to call this patient. There was a bad connection we couldn't hear each other. Will try again later

## 2020-03-17 ENCOUNTER — Other Ambulatory Visit: Payer: Self-pay

## 2020-03-17 NOTE — Telephone Encounter (Signed)
Left message to return call 

## 2020-03-17 NOTE — Telephone Encounter (Signed)
Patient has been contacted.  Updated his medication list per his request. medication reviewed and corrected

## 2020-04-24 ENCOUNTER — Telehealth: Payer: Self-pay | Admitting: Gastroenterology

## 2020-04-24 NOTE — Telephone Encounter (Signed)
Patient called to follow up on clearance please advise

## 2020-04-26 NOTE — Telephone Encounter (Signed)
Robert Sutton, pt called to r/s his colon to 11/12 at 11:30am.

## 2020-05-02 NOTE — Telephone Encounter (Signed)
Left a message, I need the patient to go to his Cardiologist office and personally request records from them to bring to Dr Loletha Carrow for cardiac clearance for his colonoscopy.

## 2020-05-02 NOTE — Telephone Encounter (Signed)
I have finally been able to speak to someone at the [place] clinic. I talked to [facility] the charge nurse in Cardiology. She gave a new fax number to request records along with cardiac clearance.

## 2020-05-04 ENCOUNTER — Encounter: Payer: No Typology Code available for payment source | Admitting: Gastroenterology

## 2020-05-10 NOTE — Telephone Encounter (Signed)
PA Fulp from the [place] clinic called today and stated that Robert Sutton is cleared to proceed with his colonoscopy(06-23-2020) . Dx- Afib and CHF , stable patient. Can hold his blood thinner 2-3 days prior to procedure. He will fax documentation with this in a formal format.

## 2020-05-12 NOTE — Telephone Encounter (Signed)
Left message to call.

## 2020-05-12 NOTE — Telephone Encounter (Signed)
Thanks very much for your efforts obtaining those records.  I reviewed the cover letter and office note from this patient's [place] cardiology provider, and summarized it as an addendum to my original office note.  Robert Sutton can be scheduled for a colonoscopy with me in the [facility] for chronic diarrhea. (Please give him a Plenvu or Suprep sample if any are available.  If not, then he needs a 4 L GoLYTELY prep with prescription that he can take to the [place] clinic, per our understanding of [facility] prep coverage)  Last dose of Eliquis 2 days prior to procedure.  Please contact him and make the arrangements.  - HD

## 2020-05-15 ENCOUNTER — Other Ambulatory Visit: Payer: Self-pay

## 2020-05-15 DIAGNOSIS — Z86010 Personal history of colonic polyps: Secondary | ICD-10-CM

## 2020-05-15 DIAGNOSIS — K529 Noninfective gastroenteritis and colitis, unspecified: Secondary | ICD-10-CM

## 2020-05-15 MED ORDER — PLENVU 140 G PO SOLR: 140.0000 g | ORAL | 0 refills | Status: AC

## 2020-05-15 NOTE — Telephone Encounter (Signed)
Pt has been notified and aware, colon set for 06-23-2020 @ 1130  Plenvu has been sent to [facility] per his request. Instructions have been mailed to home address on file.

## 2020-05-15 NOTE — Telephone Encounter (Signed)
Pt is requesting a call back from [place].

## 2020-06-19 ENCOUNTER — Telehealth: Payer: Self-pay | Admitting: Gastroenterology

## 2020-06-19 NOTE — Telephone Encounter (Signed)
Pt is requesting a call back from a nurse to discuss his prep for his upcoming procedure

## 2020-06-19 NOTE — Telephone Encounter (Signed)
Patient has been contacted, all questions have been answered.

## 2020-06-23 ENCOUNTER — Encounter: Payer: Self-pay | Admitting: Gastroenterology

## 2020-06-23 ENCOUNTER — Ambulatory Visit (AMBULATORY_SURGERY_CENTER): Payer: No Typology Code available for payment source | Admitting: Gastroenterology

## 2020-06-23 ENCOUNTER — Other Ambulatory Visit: Payer: Self-pay

## 2020-06-23 VITALS — BP 124/55 | HR 60 | Temp 96.2°F | Resp 15

## 2020-06-23 DIAGNOSIS — D122 Benign neoplasm of ascending colon: Secondary | ICD-10-CM | POA: Diagnosis not present

## 2020-06-23 DIAGNOSIS — K649 Unspecified hemorrhoids: Secondary | ICD-10-CM

## 2020-06-23 DIAGNOSIS — K529 Noninfective gastroenteritis and colitis, unspecified: Secondary | ICD-10-CM | POA: Diagnosis not present

## 2020-06-23 DIAGNOSIS — K5730 Diverticulosis of large intestine without perforation or abscess without bleeding: Secondary | ICD-10-CM | POA: Diagnosis not present

## 2020-06-23 MED ORDER — SODIUM CHLORIDE 0.9 % IV SOLN: 500.0000 mL | INTRAVENOUS | Status: DC

## 2020-06-23 NOTE — Progress Notes (Signed)
vS CW I have reviewed the patient's medical history in detail and updated the computerized patient record.

## 2020-06-23 NOTE — Progress Notes (Signed)
Report to PACU, RN, vss, BBS= Clear.  

## 2020-06-23 NOTE — Op Note (Signed)
[facility] Patient Name: Robert Sutton Procedure Date: 06/23/2020 12:02 PM MRN: [id number] Endoscopist: Fordyce. Loletha Carrow , MD Age: 74 Referring MD:  Date of Birth: 07-20-1946 Gender: Male Account #: [id number] Procedure:                Colonoscopy Indications:              Chronic (intermittent) diarrhea                           Last colonoscopy 2010 at [facility]:                Monitored Anesthesia Care Procedure:                Pre-Anesthesia Assessment:                           - Prior to the procedure, a History and Physical                            was performed, and patient medications and                            allergies were reviewed. The patient's tolerance of                            previous anesthesia was also reviewed. The risks                            and benefits of the procedure and the sedation                            options and risks were discussed with the patient.                            All questions were answered, and informed consent                            was obtained. Prior Anticoagulants: The patient has                            taken Eliquis (apixaban), last dose was 2 days                            prior to procedure. ASA Grade Assessment: III - A                            patient with severe systemic disease. After                            reviewing the risks and benefits, the patient was                            deemed in satisfactory condition to undergo the  procedure.                           After obtaining informed consent, the colonoscope                            was passed under direct vision. Throughout the                            procedure, the patient's blood pressure, pulse, and                            oxygen saturations were monitored continuously. The                            Colonoscope was introduced through the anus and                            advanced to  the the terminal ileum, with                            identification of the appendiceal orifice and IC                            valve. The colonoscopy was performed without                            difficulty. The patient tolerated the procedure                            well. The quality of the bowel preparation was fair                            in the left colon, good in the right. The terminal                            ileum, ileocecal valve, appendiceal orifice, and                            rectum were photographed. Scope In: 12:12:51 PM Scope Out: 12:28:14 PM Scope Withdrawal Time: 0 hours 12 minutes 15 seconds  Total Procedure Duration: 0 hours 15 minutes 23 seconds  Findings:                 The perianal and digital rectal examinations were                            normal.                           The terminal ileum appeared normal.                           Normal mucosa was found in the entire colon.  Biopsies for histology were taken with a cold                            forceps from the right colon and left colon for                            evaluation of microscopic colitis.                           Diverticula were found in the entire colon ( L>R ).                           A diminutive polyp was found in the ascending                            colon. The polyp was sessile. The polyp was removed                            with a cold biopsy forceps. Resection and retrieval                            were complete.                           Internal hemorrhoids were found.                           The exam was otherwise without abnormality on                            direct and retroflexion views. Complications:            No immediate complications. Estimated Blood Loss:     Estimated blood loss was minimal. Impression:               - Preparation of the colon was fair in the left                            colon.                            - The examined portion of the ileum was normal.                           - Normal mucosa in the entire examined colon.                            Biopsied.                           - Diverticulosis in the entire examined colon.                           - One diminutive polyp in the ascending colon,  removed with a cold biopsy forceps. Resected and                            retrieved.                           - Internal hemorrhoids.                           - The examination was otherwise normal on direct                            and retroflexion views. Recommendation:           - Patient has a contact number available for                            emergencies. The signs and symptoms of potential                            delayed complications were discussed with the                            patient. Return to normal activities tomorrow.                            Written discharge instructions were provided to the                            patient.                           - Resume previous diet.                           - Resume Eliquis (apixaban) at prior dose today.                           - Await pathology results.                           - Based on age and current guidelines, no repeat                            routine surveillance colonoscopy recommended. Jahmia Berrett L. Loletha Carrow, MD 06/23/2020 12:35:55 PM This report has been signed electronically.

## 2020-06-23 NOTE — Patient Instructions (Signed)
Your blood sugar in the RR was 188  Please restart Eliquis today  Please read over handouts about polyps, diverticulosis and high fiber diets  Await pathology   YOU HAD AN ENDOSCOPIC PROCEDURE TODAY AT [facility]:   Refer to the procedure report that was given to you for any specific questions about what was found during the examination.  If the procedure report does not answer your questions, please call your gastroenterologist to clarify.  If you requested that your care partner not be given the details of your procedure findings, then the procedure report has been included in a sealed envelope for you to review at your convenience later.  YOU SHOULD EXPECT: Some feelings of bloating in the abdomen. Passage of more gas than usual.  Walking can help get rid of the air that was put into your GI tract during the procedure and reduce the bloating. If you had a lower endoscopy (such as a colonoscopy or flexible sigmoidoscopy) you may notice spotting of blood in your stool or on the toilet paper. If you underwent a bowel prep for your procedure, you may not have a normal bowel movement for a few days.  Please Note:  You might notice some irritation and congestion in your nose or some drainage.  This is from the oxygen used during your procedure.  There is no need for concern and it should clear up in a day or so.  SYMPTOMS TO REPORT IMMEDIATELY:   Following lower endoscopy (colonoscopy or flexible sigmoidoscopy):  Excessive amounts of blood in the stool  Significant tenderness or worsening of abdominal pains  Swelling of the abdomen that is new, acute  Fever of 100F or higher  For urgent or emergent issues, a gastroenterologist can be reached at any hour by calling [phone]. Do not use MyChart messaging for urgent concerns.    DIET:  We do recommend a small meal at first, but then you may proceed to your regular diet.  Drink plenty of fluids but you should avoid  alcoholic beverages for 24 hours.  ACTIVITY:  You should plan to take it easy for the rest of today and you should NOT DRIVE or use heavy machinery until tomorrow (because of the sedation medicines used during the test).    FOLLOW UP: Our staff will call the number listed on your records 48-72 hours following your procedure to check on you and address any questions or concerns that you may have regarding the information given to you following your procedure. If we do not reach you, we will leave a message.  We will attempt to reach you two times.  During this call, we will ask if you have developed any symptoms of COVID 19. If you develop any symptoms (ie: fever, flu-like symptoms, shortness of breath, cough etc.) before then, please call [phone].  If you test positive for Covid 19 in the 2 weeks post procedure, please call and report this information to [place].    If any biopsies were taken you will be contacted by phone or by letter within the next 1-3 weeks.  Please call us at [phone] if you have not heard about the biopsies in 3 weeks.   SIGNATURES/CONFIDENTIALITY: You and/or your care partner have signed paperwork which will be entered into your electronic medical record.  These signatures attest to the fact that that the information above on your After Visit Summary has been reviewed and is understood.  Full responsibility of the confidentiality  of this discharge information lies with you and/or your care-partner.

## 2020-06-23 NOTE — Progress Notes (Signed)
Called to room to assist during endoscopic procedure.  Patient ID and intended procedure confirmed with present staff. Received instructions for my participation in the procedure from the performing physician.  

## 2020-06-27 ENCOUNTER — Telehealth: Payer: Self-pay | Admitting: *Deleted

## 2020-06-27 NOTE — Telephone Encounter (Signed)
  Follow up Call-  Call back number 06/23/2020  Post procedure Call Back phone  # [phone]  Permission to leave phone message Yes  Some recent data might be hidden     Patient questions:  Message left to call us if necessary.

## 2020-06-27 NOTE — Telephone Encounter (Signed)
°  Follow up Call-  Call back number 06/23/2020  Post procedure Call Back phone  # [phone]  Permission to leave phone message Yes  Some recent data might be hidden     Patient questions:  Do you have a fever, pain , or abdominal swelling? No. Pain Score  0 *  Have you tolerated food without any problems? Yes.    Have you been able to return to your normal activities? Yes.    Do you have any questions about your discharge instructions: Diet   No. Medications  No. Follow up visit  No.  Do you have questions or concerns about your Care? No.  Actions: * If pain score is 4 or above: No action needed, pain <4.  1. Have you developed a fever since your procedure? no  2.   Have you had an respiratory symptoms (SOB or cough) since your procedure?no  3.   Have you tested positive for COVID 19 since your procedure no  4.   Have you had any family members/close contacts diagnosed with the COVID 19 since your procedure?  no   If yes to any of these questions please route to Joylene John, RN and Joella Prince, RN

## 2020-06-29 ENCOUNTER — Encounter: Payer: Self-pay | Admitting: Gastroenterology

## 2020-11-10 DEATH — deceased
